# Patient Record
Sex: Female | Born: 1990 | Race: Black or African American | Hispanic: No | Marital: Single | Smoking: Never smoker
Health system: Southern US, Community
[De-identification: ages and names within clinical notes are randomized; demographics above are authoritative.]

## PROBLEM LIST (undated history)

## (undated) ENCOUNTER — Inpatient Hospital Stay (HOSPITAL_COMMUNITY): Payer: Self-pay

## (undated) DIAGNOSIS — F32A Depression, unspecified: Secondary | ICD-10-CM

## (undated) DIAGNOSIS — J45909 Unspecified asthma, uncomplicated: Secondary | ICD-10-CM

## (undated) DIAGNOSIS — E0590 Thyrotoxicosis, unspecified without thyrotoxic crisis or storm: Secondary | ICD-10-CM

## (undated) DIAGNOSIS — F419 Anxiety disorder, unspecified: Secondary | ICD-10-CM

## (undated) DIAGNOSIS — F329 Major depressive disorder, single episode, unspecified: Secondary | ICD-10-CM

## (undated) HISTORY — DX: Unspecified asthma, uncomplicated: J45.909

## (undated) HISTORY — PX: WISDOM TOOTH EXTRACTION: SHX21

## (undated) HISTORY — DX: Anxiety disorder, unspecified: F41.9

---
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
## 2015-03-18 ENCOUNTER — Telehealth: Payer: Self-pay | Admitting: *Deleted

## 2015-03-18 ENCOUNTER — Encounter: Payer: Self-pay | Admitting: *Deleted

## 2015-03-18 NOTE — Telephone Encounter (Signed)
Pre-Visit Call completed with patient and chart updated.   Pre-Visit Info documented in Specialty Comments under SnapShot.    

## 2015-03-21 ENCOUNTER — Ambulatory Visit (INDEPENDENT_AMBULATORY_CARE_PROVIDER_SITE_OTHER): Payer: Federal, State, Local not specified - PPO | Admitting: Physician Assistant

## 2015-03-21 ENCOUNTER — Encounter: Payer: Self-pay | Admitting: Physician Assistant

## 2015-03-21 VITALS — BP 93/53 | HR 75 | Temp 98.4°F | Resp 16 | Ht 68.0 in | Wt 181.5 lb

## 2015-03-21 DIAGNOSIS — F418 Other specified anxiety disorders: Secondary | ICD-10-CM

## 2015-03-21 DIAGNOSIS — F32A Depression, unspecified: Secondary | ICD-10-CM | POA: Insufficient documentation

## 2015-03-21 DIAGNOSIS — F329 Major depressive disorder, single episode, unspecified: Secondary | ICD-10-CM | POA: Insufficient documentation

## 2015-03-21 DIAGNOSIS — F419 Anxiety disorder, unspecified: Principal | ICD-10-CM

## 2015-03-21 LAB — CBC
HCT: 34.6 % — ABNORMAL LOW (ref 36.0–46.0)
Hemoglobin: 11.5 g/dL — ABNORMAL LOW (ref 12.0–15.0)
MCHC: 33.2 g/dL (ref 30.0–36.0)
MCV: 87.2 fl (ref 78.0–100.0)
Platelets: 188 10*3/uL (ref 150.0–400.0)
RBC: 3.97 Mil/uL (ref 3.87–5.11)
RDW: 14.7 % (ref 11.5–15.5)
WBC: 6.1 10*3/uL (ref 4.0–10.5)

## 2015-03-21 LAB — TSH: TSH: 0.29 u[IU]/mL — ABNORMAL LOW (ref 0.35–4.50)

## 2015-03-21 MED ORDER — ALPRAZOLAM 1 MG PO TABS: 1.0000 mg | ORAL_TABLET | Freq: Two times a day (BID) | ORAL | Status: DC | PRN

## 2015-03-21 MED ORDER — FLUOXETINE HCL 20 MG PO TABS: ORAL_TABLET | ORAL | Status: DC

## 2015-03-21 NOTE — Assessment & Plan Note (Addendum)
Will begin Fluoxetine 10 mg QD x 3 days before increasing to 20 mg QD.  Will also begin Xanax only PRN for acute anxiety.   Will check CBC and TSH today.  Patient instructed to follow-up with counseling as scheduled.  Short-term disability paperwork received from patient.  Will call once complete.  Follow-up in 1 month.

## 2015-03-21 NOTE — Progress Notes (Signed)
Pre visit review using our clinic review tool, if applicable. No additional management support is needed unless otherwise documented below in the visit note/SLS  

## 2015-03-21 NOTE — Patient Instructions (Signed)
Please go to the lab for blood work. I will call you with your results.  Please take the Fluoxetine as directed. Use the Xanax only if needed for acute anxiety.  Use no more than directed. Go to counseling session as scheduled.

## 2015-03-21 NOTE — Progress Notes (Signed)
   Patient presents to clinic today to establish care.  Acute Concerns: Patient c/o anxiety and depressed mood over the past year, that has been culminating in panic attacks starting 2-3 weeks ago.  Feels like she is becoming more introverted.  Patient was seen at outside Urgent Care on 4/16 for panic attacks.  Was given Rx for Paxil but states the medication made her mood worse.  Patient feels anxiety is mostly stemming from work and unhappiness with career.  Endorses some mild irregularity in stools with anxiety.  Endorses insomnia and anorexia. Denies SI/HI.  Has appointment with counseling services this week.  Past Medical History  Diagnosis Date  . Anxiety   . Childhood asthma     Resolved    Past Surgical History  Procedure Laterality Date  . Wisdom tooth extraction      No current outpatient prescriptions on file prior to visit.   No current facility-administered medications on file prior to visit.    No Known Allergies  Family History  Problem Relation Age of Onset  . Healthy Mother     Living  . Healthy Father     Living  . Diabetes Maternal Grandmother   . Diabetes Maternal Aunt   . Healthy Sister     x1  . Asthma Sister     #2-Resolved    History   Social History  . Marital Status: Single    Spouse Name: N/A  . Number of Children: N/A  . Years of Education: N/A   Occupational History  . Not on file.   Social History Main Topics  . Smoking status: Never Smoker   . Smokeless tobacco: Not on file  . Alcohol Use: Not on file  . Drug Use: Not on file  . Sexual Activity: Not on file   Other Topics Concern  . Not on file   Social History Narrative   ROS Pertinent ROS are listed in the HPI.  BP 93/53 mmHg  Pulse 75  Temp(Src) 98.4 F (36.9 C) (Oral)  Resp 16  Ht 5\' 8"  (1.727 m)  Wt 181 lb 8 oz (82.328 kg)  BMI 27.60 kg/m2  SpO2 100%  LMP 03/07/2015  Physical Exam  Constitutional: She is oriented to person, place, and time and  well-developed, well-nourished, and in no distress.  HENT:  Head: Normocephalic and atraumatic.  Eyes: Conjunctivae are normal.  Neck: Neck supple. No thyromegaly present.  Cardiovascular: Normal rate, regular rhythm, normal heart sounds and intact distal pulses.   Pulmonary/Chest: Effort normal and breath sounds normal. No respiratory distress. She has no wheezes. She has no rales. She exhibits no tenderness.  Lymphadenopathy:    She has no cervical adenopathy.  Neurological: She is alert and oriented to person, place, and time.  Skin: Skin is warm and dry. No rash noted.  Psychiatric: Affect normal.  Vitals reviewed.  Assessment/Plan: Anxiety and depression Will begin Fluoxetine 10 mg QD x 3 days before increasing to 20 mg QD.  Will also begin Xanax only PRN for acute anxiety.   Will check CBC and TSH today.  Patient instructed to follow-up with counseling as scheduled.  Short-term disability paperwork received from patient.  Will call once complete.  Follow-up in 1 month.

## 2015-03-23 ENCOUNTER — Other Ambulatory Visit: Payer: Self-pay | Admitting: Physician Assistant

## 2015-03-23 DIAGNOSIS — R7989 Other specified abnormal findings of blood chemistry: Secondary | ICD-10-CM

## 2015-03-24 ENCOUNTER — Telehealth: Payer: Self-pay | Admitting: Physician Assistant

## 2015-03-24 NOTE — Telephone Encounter (Signed)
Caller name: Alaena Relation to pt: self Call back number: [phone] Pharmacy:  Reason for call:   Patient states that Selena Batten[employer]ody was going to fax over FMLA forms yesterday to her employer but they are stating that they have not received fax as of yet. Patient is also requesting a copy for herself and will pick up.

## 2015-03-24 NOTE — Telephone Encounter (Signed)
Per Heidi Ford's OV note, we will call pt once forms are ready. JG//CMA

## 2015-03-25 NOTE — Telephone Encounter (Signed)
Forms faxed successfully to [employer], confirmation received. Called and informed pt and let her know that originals are at our front desk for her to pick up. Copy sent for scanning. JG//CMA

## 2015-03-25 NOTE — Telephone Encounter (Signed)
Informed patient of this. She states employer needs this by Tuesday at the latest

## 2015-04-06 ENCOUNTER — Other Ambulatory Visit (INDEPENDENT_AMBULATORY_CARE_PROVIDER_SITE_OTHER): Payer: Federal, State, Local not specified - PPO

## 2015-04-06 DIAGNOSIS — R7989 Other specified abnormal findings of blood chemistry: Secondary | ICD-10-CM

## 2015-04-06 DIAGNOSIS — R946 Abnormal results of thyroid function studies: Secondary | ICD-10-CM | POA: Diagnosis not present

## 2015-04-06 LAB — T4, FREE: Free T4: 0.87 ng/dL (ref 0.60–1.60)

## 2015-04-06 LAB — TSH: TSH: 0.3 u[IU]/mL — ABNORMAL LOW (ref 0.35–4.50)

## 2015-06-06 ENCOUNTER — Encounter: Payer: Self-pay | Admitting: Physician Assistant

## 2015-06-06 ENCOUNTER — Ambulatory Visit (INDEPENDENT_AMBULATORY_CARE_PROVIDER_SITE_OTHER): Payer: Federal, State, Local not specified - PPO | Admitting: Physician Assistant

## 2015-06-06 ENCOUNTER — Ambulatory Visit: Payer: Federal, State, Local not specified - PPO | Admitting: Physician Assistant

## 2015-06-06 VITALS — BP 124/61 | HR 87 | Temp 98.2°F | Ht 68.0 in | Wt 164.6 lb

## 2015-06-06 DIAGNOSIS — M79672 Pain in left foot: Secondary | ICD-10-CM | POA: Diagnosis not present

## 2015-06-06 DIAGNOSIS — M79671 Pain in right foot: Secondary | ICD-10-CM | POA: Diagnosis not present

## 2015-06-06 MED ORDER — MELOXICAM 15 MG PO TABS: 15.0000 mg | ORAL_TABLET | Freq: Every day | ORAL | Status: DC

## 2015-06-06 NOTE — Patient Instructions (Signed)
Please take Meloxicam once daily with food as directed. Use Tylenol Extra Strength later in the day if needed for breakthrough pain. Alternate ice/heat to knee. Wear supportive shoes. Take a break from the gym over the next week. Follow-up if symptoms are not resolving.

## 2015-06-06 NOTE — Progress Notes (Signed)
Pre visit review using our clinic review tool, if applicable. No additional management support is needed unless otherwise documented below in the visit note. 

## 2015-06-06 NOTE — Assessment & Plan Note (Signed)
Muscular. Rx Mobic. Limit gym time over next week. Supportive footwear and RICE therapy reviewed and recommended. Follow-up if symptoms are not resolving.

## 2015-06-06 NOTE — Progress Notes (Signed)
Patient presents to clinic today c/o pain in bilateral feet x 1 week described as throbbing.  Has recently started working out and noticed pain started the morning following an intense workout. Also notes mild L knee pain with walking but denies swelling, bruising, numbness or tingling.  Has been taking Advil with some relief of symptoms.   Past Medical History  Diagnosis Date  . Anxiety   . Childhood asthma     Resolved    Current Outpatient Prescriptions on File Prior to Visit  Medication Sig Dispense Refill  . ALPRAZolam (XANAX) 1 MG tablet Take 1 tablet (1 mg total) by mouth 2 (two) times daily as needed for anxiety. 30 tablet 0  . FLUoxetine (PROZAC) 20 MG tablet Take 1/2 tablet by mouth daily for 3 days.  Then increase to 1 tablet daily. 30 tablet 3   No current facility-administered medications on file prior to visit.    No Known Allergies  Family History  Problem Relation Age of Onset  . Healthy Mother     Living  . Healthy Father     Living  . Diabetes Maternal Grandmother   . Diabetes Maternal Aunt   . Healthy Sister     x1  . Asthma Sister     #2-Resolved    History   Social History  . Marital Status: Single    Spouse Name: N/A  . Number of Children: N/A  . Years of Education: N/A   Social History Main Topics  . Smoking status: Never Smoker   . Smokeless tobacco: Not on file  . Alcohol Use: Not on file  . Drug Use: Not on file  . Sexual Activity: Not on file   Other Topics Concern  . None   Social History Narrative   Review of Systems - See HPI.  All other ROS are negative.  BP 124/61 mmHg  Pulse 87  Temp(Src) 98.2 F (36.8 C) (Oral)  Ht  (1.727 m)  Wt 164 lb 9.6 oz (74.662 kg)  BMI 25.03 kg/m2  SpO2 100%  LMP 05/24/2015  Physical Exam  Constitutional: She is well-developed, well-nourished, and in no distress.  HENT:  Head: Normocephalic and atraumatic.  Eyes: Conjunctivae are normal.  Cardiovascular: Normal rate, regular  rhythm, normal heart sounds and intact distal pulses.   Pulmonary/Chest: Effort normal and breath sounds normal. No respiratory distress. She has no wheezes. She has no rales. She exhibits no tenderness.  Musculoskeletal:       Right knee: Normal.       Left knee: Normal.       Right ankle: Normal.       Left ankle: Normal.       Right foot: Normal.       Left foot: Normal.  Vitals reviewed.   Recent Results (from the past 2160 hour(s))  TSH     Status: Abnormal   Collection Time: 03/21/15  9:34 AM  Result Value Ref Range   TSH 0.29 (L) 0.35 - 4.50 uIU/mL  CBC     Status: Abnormal   Collection Time: 03/21/15  9:34 AM  Result Value Ref Range   WBC 6.1 4.0 - 10.5 K/uL   RBC 3.97 3.87 - 5.11 Mil/uL   Platelets 188.0 150.0 - 400.0 K/uL   Hemoglobin 11.5 (L) 12.0 - 15.0 g/dL   HCT 96.0 (L) 45.4 - 09.8 %   MCV 87.2 78.0 - 100.0 fl   MCHC 33.2 30.0 - 36.0 g/dL  RDW 14.7 11.5 - 15.5 %  TSH     Status: Abnormal   Collection Time: 04/06/15  1:27 PM  Result Value Ref Range   TSH 0.30 (L) 0.35 - 4.50 uIU/mL  T4, free     Status: None   Collection Time: 04/06/15  1:27 PM  Result Value Ref Range   Free T4 0.87 0.60 - 1.60 ng/dL    Assessment/Plan: Pain in both feet Muscular. Rx Mobic. Limit gym time over next week. Supportive footwear and RICE therapy reviewed and recommended. Follow-up if symptoms are not resolving.

## 2015-06-07 ENCOUNTER — Telehealth: Payer: Self-pay | Admitting: Physician Assistant

## 2015-06-07 NOTE — Telephone Encounter (Signed)
Pt was no show 06/06/15 8:15am but came in at 3:00pm same day, charge no show fee?

## 2015-06-08 NOTE — Telephone Encounter (Signed)
No charge. 

## 2015-07-05 ENCOUNTER — Telehealth: Payer: Self-pay | Admitting: Physician Assistant

## 2015-07-05 NOTE — Telephone Encounter (Signed)
Caller name: Relation to ZO:XWRU Call back [phone] Pharmacy:  Reason for call: pt is needing to get a copy immunization records for school. Please call when ready pt will come pick up Pt need asap, class start on next Wednesday.

## 2015-07-06 NOTE — Telephone Encounter (Signed)
Called and spoke with the pt and informed her that we do not have any immunization records on her.  Pt verbalized understanding and stated that she will call her parents to see where she needs to the records.//AB/CMA

## 2015-08-30 ENCOUNTER — Encounter: Payer: Self-pay | Admitting: Physician Assistant

## 2015-08-30 ENCOUNTER — Ambulatory Visit (INDEPENDENT_AMBULATORY_CARE_PROVIDER_SITE_OTHER): Payer: Federal, State, Local not specified - PPO | Admitting: Physician Assistant

## 2015-08-30 ENCOUNTER — Other Ambulatory Visit: Payer: Federal, State, Local not specified - PPO

## 2015-08-30 ENCOUNTER — Telehealth: Payer: Self-pay | Admitting: Physician Assistant

## 2015-08-30 VITALS — BP 98/66 | HR 94 | Temp 97.7°F | Resp 16 | Ht 68.0 in | Wt 142.4 lb

## 2015-08-30 DIAGNOSIS — N926 Irregular menstruation, unspecified: Secondary | ICD-10-CM

## 2015-08-30 DIAGNOSIS — O219 Vomiting of pregnancy, unspecified: Secondary | ICD-10-CM | POA: Diagnosis not present

## 2015-08-30 DIAGNOSIS — Z3201 Encounter for pregnancy test, result positive: Secondary | ICD-10-CM

## 2015-08-30 DIAGNOSIS — N910 Primary amenorrhea: Secondary | ICD-10-CM

## 2015-08-30 DIAGNOSIS — Z331 Pregnant state, incidental: Secondary | ICD-10-CM

## 2015-08-30 DIAGNOSIS — Z3490 Encounter for supervision of normal pregnancy, unspecified, unspecified trimester: Secondary | ICD-10-CM

## 2015-08-30 LAB — POCT URINE PREGNANCY: Preg Test, Ur: POSITIVE — AB

## 2015-08-30 MED ORDER — PYRIDOXINE HCL 25 MG PO TABS: 25.0000 mg | ORAL_TABLET | Freq: Three times a day (TID) | ORAL | Status: DC

## 2015-08-30 NOTE — Assessment & Plan Note (Signed)
Urine test confirms. Will check quantitative HCG today. Referral to OB placed. Will start Folic acid supplement daily. Rx Pyridoxine for nausea and vomiting. Will call with results.

## 2015-08-30 NOTE — Progress Notes (Signed)
Pre visit review using our clinic review tool, if applicable. No additional management support is needed unless otherwise documented below in the visit note/SLS  

## 2015-08-30 NOTE — Patient Instructions (Signed)
Please go to the lab for blood work. I will call with results. You will be contacted for assessment by OB/GYN. Please avoid starting new medications without consulting a pharmacist or a provider. Tylenol when needed for pain. Start a Folic acid supplement daily -- 600 mcg daily Take the nausea medication as directed when needed.   First Trimester of Pregnancy The first trimester of pregnancy is from week 1 until the end of week 12 (months 1 through 3). During this time, your baby will begin to develop inside you. At 6-8 weeks, the eyes and face are formed, and the heartbeat can be seen on ultrasound. At the end of 12 weeks, all the baby's organs are formed. Prenatal care is all the medical care you receive before the birth of your baby. Make sure you get good prenatal care and follow all of your doctor's instructions. HOME CARE  Medicines  Take medicine only as told by your doctor. Some medicines are safe and some are not during pregnancy.  Take your prenatal vitamins as told by your doctor.  Take medicine that helps you poop (stool softener) as needed if your doctor says it is okay. Diet  Eat regular, healthy meals.  Your doctor will tell you the amount of weight gain that is right for you.  Avoid raw meat and uncooked cheese.  If you feel sick to your stomach (nauseous) or throw up (vomit):  Eat 4 or 5 small meals a day instead of 3 large meals.  Try eating a few soda crackers.  Drink liquids between meals instead of during meals.  If you have a hard time pooping (constipation):  Eat high-fiber foods like fresh vegetables, fruit, and whole grains.  Drink enough fluids to keep your pee (urine) clear or pale yellow. Activity and Exercise  Exercise only as told by your doctor. Stop exercising if you have cramps or pain in your lower belly (abdomen) or low back.  Try to avoid standing for long periods of time. Move your legs often if you must stand in one place for a long  time.  Avoid heavy lifting.  Wear low-heeled shoes. Sit and stand up straight.  You can have sex unless your doctor tells you not to. Relief of Pain or Discomfort  Wear a good support bra if your breasts are sore.  Take warm water baths (sitz baths) to soothe pain or discomfort caused by hemorrhoids. Use hemorrhoid cream if your doctor says it is okay.  Rest with your legs raised if you have leg cramps or low back pain.  Wear support hose if you have puffy, bulging veins (varicose veins) in your legs. Raise (elevate) your feet for 15 minutes, 3-4 times a day. Limit salt in your diet. Prenatal Care  Schedule your prenatal visits by the twelfth week of pregnancy.  Write down your questions. Take them to your prenatal visits.  Keep all your prenatal visits as told by your doctor. Safety  Wear your seat belt at all times when driving.  Make a list of emergency phone numbers. The list should include numbers for family, friends, the hospital, and police and fire departments. General Tips  Ask your doctor for a referral to a local prenatal class. Begin classes no later than at the start of month 6 of your pregnancy.  Ask for help if you need counseling or help with nutrition. Your doctor can give you advice or tell you where to go for help.  Do not use hot tubs,  steam rooms, or saunas.  Do not douche or use tampons or scented sanitary pads.  Do not cross your legs for long periods of time.  Avoid litter boxes and soil used by cats.  Avoid all smoking, herbs, and alcohol. Avoid drugs not approved by your doctor.  Visit your dentist. At home, brush your teeth with a soft toothbrush. Be gentle when you floss. GET HELP IF:  You are dizzy.  You have mild cramps or pressure in your lower belly.  You have a nagging pain in your belly area.  You continue to feel sick to your stomach, throw up, or have watery poop (diarrhea).  You have a bad smelling fluid coming from your  vagina.  You have pain with peeing (urination).  You have increased puffiness (swelling) in your face, hands, legs, or ankles. GET HELP RIGHT AWAY IF:   You have a fever.  You are leaking fluid from your vagina.  You have spotting or bleeding from your vagina.  You have very bad belly cramping or pain.  You gain or lose weight rapidly.  You throw up blood. It may look like coffee grounds.  You are around people who have [place] measles, fifth disease, or chickenpox.  You have a very bad headache.  You have shortness of breath.  You have any kind of trauma, such as from a fall or a car accident. Document Released: 04/30/2008 Document Revised: 03/29/2014 Document Reviewed: 09/22/2013 [facility] Patient Information 2015 [place], [place]. This information is not intended to replace advice given to you by your health care provider. Make sure you discuss any questions you have with your health care provider.

## 2015-08-30 NOTE — Telephone Encounter (Signed)
Relation to EA:VWUJ Call back number: [phone]  Pharmacy: [id number] - [address] [place] - [address] AT [facility] [address] [phone] (Phone) [phone] (Fax)         Reason for call:  Pharmacy prescribed 100 MG of pyridOXINE (VITAMIN B-6) patient would like clarification of direction. Patient states that's not what you ordered but that's what pharmacy had. Please advise

## 2015-08-30 NOTE — Telephone Encounter (Signed)
Patient called back and reiterated instructions, understood 7 agreed/SLS

## 2015-08-30 NOTE — Telephone Encounter (Signed)
Spoke with the pharmacist at [facility] and was informed that patient's [aunt or mother?] came in to p/u Rx for pt, and because this is OTC supplement, 100 mg is the lowest dose available at pharmacy [you may be able to find lower dose at specialty vitamin store], and this why she was given/purchased this dosage. Informed pharmacist that patient does not have Vitamin B deficiency, that she has just has pregnancy confirmed today and this was px only for her nausea; and suggest 50 mg BID of the 100 mg LMOM with contact name and number [for return call, if needed] RE: Pyridoxine dosage per pharmacist and further provider instructions/SLS

## 2015-08-30 NOTE — Telephone Encounter (Addendum)
Definitely not what she needs. I do not want her taking 100 mg that they gave her. She can cut in half ant take 1/2 tablet (50 mg) twice daily instead since medication has been purchased already.

## 2015-08-30 NOTE — Progress Notes (Signed)
    Patient presents to clinic today requesting confirmation of+ home pregnancy test. Endorses taking home test 1 week ago. LMP 07/16/15. Endorses some mild nausea and vomiting in throughout the day.  Is prescribed Fluoxetine and Xanax for anxiety and depression. Stopped both medications   Past Medical History  Diagnosis Date  . Anxiety   . Childhood asthma     Resolved    No current outpatient prescriptions on file prior to visit.   No current facility-administered medications on file prior to visit.    No Known Allergies  Family History  Problem Relation Age of Onset  . Healthy Mother     Living  . Healthy Father     Living  . Diabetes Maternal Grandmother   . Diabetes Maternal Aunt   . Healthy Sister     x1  . Asthma Sister     #2-Resolved    Social History   Social History  . Marital Status: Single    Spouse Name: N/A  . Number of Children: N/A  . Years of Education: N/A   Social History Main Topics  . Smoking status: Never Smoker   . Smokeless tobacco: None  . Alcohol Use: None  . Drug Use: None  . Sexual Activity: Not Asked   Other Topics Concern  . None   Social History Narrative   Review of Systems - See HPI.  All other ROS are negative.  BP 98/66 mmHg  Pulse 94  Temp(Src) 97.7 F (36.5 C) (Oral)  Resp 16  Ht  (1.727 m)  Wt 142 lb 6 oz (64.581 kg)  BMI 21.65 kg/m2  SpO2 100%  LMP 07/16/2015  Physical Exam  Constitutional: She is oriented to person, place, and time and well-developed, well-nourished, and in no distress.  HENT:  Head: Normocephalic and atraumatic.  Eyes: Conjunctivae are normal.  Cardiovascular: Normal rate, regular rhythm, normal heart sounds and intact distal pulses.   Pulmonary/Chest: Effort normal and breath sounds normal. No respiratory distress. She has no wheezes. She has no rales. She exhibits no tenderness.  Neurological: She is alert and oriented to person, place, and time.  Skin: Skin is warm and dry. No  rash noted.  Vitals reviewed.    Assessment/Plan: Pregnant Urine test confirms. Will check quantitative HCG today. Referral to OB placed. Will start Folic acid supplement daily. Rx Pyridoxine for nausea and vomiting. Will call with results.

## 2015-08-31 LAB — HCG, QUANTITATIVE, PREGNANCY: HCG, BETA CHAIN, QUANT, S: 166204.5 m[IU]/mL

## 2015-09-05 ENCOUNTER — Ambulatory Visit: Payer: Federal, State, Local not specified - PPO | Admitting: Medical

## 2015-09-05 ENCOUNTER — Observation Stay (HOSPITAL_COMMUNITY)
Admission: AD | Admit: 2015-09-05 | Discharge: 2015-09-06 | Disposition: A | Discharge status: Home / Self Care | Payer: Federal, State, Local not specified - PPO | Source: Ambulatory Visit | Attending: Obstetrics and Gynecology | Admitting: Obstetrics and Gynecology

## 2015-09-05 ENCOUNTER — Encounter (HOSPITAL_COMMUNITY): Payer: Self-pay | Admitting: *Deleted

## 2015-09-05 DIAGNOSIS — Z3A01 Less than 8 weeks gestation of pregnancy: Secondary | ICD-10-CM | POA: Insufficient documentation

## 2015-09-05 DIAGNOSIS — O211 Hyperemesis gravidarum with metabolic disturbance: Secondary | ICD-10-CM | POA: Diagnosis present

## 2015-09-05 DIAGNOSIS — O99341 Other mental disorders complicating pregnancy, first trimester: Secondary | ICD-10-CM | POA: Insufficient documentation

## 2015-09-05 DIAGNOSIS — O210 Mild hyperemesis gravidarum: Principal | ICD-10-CM | POA: Insufficient documentation

## 2015-09-05 DIAGNOSIS — F419 Anxiety disorder, unspecified: Secondary | ICD-10-CM | POA: Insufficient documentation

## 2015-09-05 DIAGNOSIS — O99511 Diseases of the respiratory system complicating pregnancy, first trimester: Secondary | ICD-10-CM | POA: Insufficient documentation

## 2015-09-05 DIAGNOSIS — J45909 Unspecified asthma, uncomplicated: Secondary | ICD-10-CM | POA: Insufficient documentation

## 2015-09-05 LAB — CBC
HCT: 33.8 % — ABNORMAL LOW (ref 36.0–46.0)
Hemoglobin: 11.5 g/dL — ABNORMAL LOW (ref 12.0–15.0)
MCH: 29.6 pg (ref 26.0–34.0)
MCHC: 34 g/dL (ref 30.0–36.0)
MCV: 87.1 fL (ref 78.0–100.0)
PLATELETS: 165 10*3/uL (ref 150–400)
RBC: 3.88 MIL/uL (ref 3.87–5.11)
RDW: 13.1 % (ref 11.5–15.5)
WBC: 6.5 10*3/uL (ref 4.0–10.5)

## 2015-09-05 LAB — COMPREHENSIVE METABOLIC PANEL
ALT: 15 U/L (ref 14–54)
AST: 16 U/L (ref 15–41)
Albumin: 4.1 g/dL (ref 3.5–5.0)
Alkaline Phosphatase: 39 U/L (ref 38–126)
Anion gap: 8 (ref 5–15)
BUN: 12 mg/dL (ref 6–20)
CHLORIDE: 102 mmol/L (ref 101–111)
CO2: 24 mmol/L (ref 22–32)
CREATININE: 0.56 mg/dL (ref 0.44–1.00)
Calcium: 9.6 mg/dL (ref 8.9–10.3)
Glucose, Bld: 107 mg/dL — ABNORMAL HIGH (ref 65–99)
POTASSIUM: 3.1 mmol/L — AB (ref 3.5–5.1)
SODIUM: 134 mmol/L — AB (ref 135–145)
Total Bilirubin: 0.9 mg/dL (ref 0.3–1.2)
Total Protein: 7.4 g/dL (ref 6.5–8.1)

## 2015-09-05 LAB — TYPE AND SCREEN
ABO/RH(D): O POS
ANTIBODY SCREEN: NEGATIVE

## 2015-09-05 LAB — URINALYSIS, ROUTINE W REFLEX MICROSCOPIC
Glucose, UA: NEGATIVE mg/dL
Leukocytes, UA: NEGATIVE
NITRITE: NEGATIVE
PROTEIN: 30 mg/dL — AB
SPECIFIC GRAVITY, URINE: 1.025 (ref 1.005–1.030)
UROBILINOGEN UA: 1 mg/dL (ref 0.0–1.0)
pH: 6 (ref 5.0–8.0)

## 2015-09-05 LAB — URINE MICROSCOPIC-ADD ON

## 2015-09-05 MED ORDER — LACTATED RINGERS IV SOLN
INTRAVENOUS | Status: DC
  Administered 2015-09-05: 125 mL/h via INTRAVENOUS
  Administered 2015-09-06 (×2): via INTRAVENOUS

## 2015-09-05 MED ORDER — LACTATED RINGERS IV BOLUS (SEPSIS)
1000.0000 mL | Freq: Once | INTRAVENOUS | Status: AC
  Administered 2015-09-05: 1000 mL via INTRAVENOUS

## 2015-09-05 MED ORDER — ZOLPIDEM TARTRATE 5 MG PO TABS: 5.0000 mg | ORAL_TABLET | Freq: Every evening | ORAL | Status: DC | PRN

## 2015-09-05 MED ORDER — PROMETHAZINE HCL 25 MG/ML IJ SOLN
12.5000 mg | Freq: Four times a day (QID) | INTRAMUSCULAR | Status: DC | PRN
Start: 2015-09-05 — End: 2015-09-06

## 2015-09-05 MED ORDER — PROMETHAZINE HCL 25 MG/ML IJ SOLN
12.5000 mg | Freq: Once | INTRAMUSCULAR | Status: AC
  Administered 2015-09-05: 12.5 mg via INTRAVENOUS
  Filled 2015-09-05: qty 1

## 2015-09-05 MED ORDER — ONDANSETRON HCL 4 MG/2ML IJ SOLN: 4.0000 mg | Freq: Four times a day (QID) | INTRAMUSCULAR | Status: DC | PRN

## 2015-09-05 MED ORDER — DOCUSATE SODIUM 100 MG PO CAPS
100.0000 mg | ORAL_CAPSULE | Freq: Every day | ORAL | Status: DC
  Administered 2015-09-06: 100 mg via ORAL
  Filled 2015-09-05: qty 1

## 2015-09-05 MED ORDER — CALCIUM CARBONATE ANTACID 500 MG PO CHEW: 2.0000 | CHEWABLE_TABLET | ORAL | Status: DC | PRN

## 2015-09-05 MED ORDER — ACETAMINOPHEN 325 MG PO TABS: 650.0000 mg | ORAL_TABLET | ORAL | Status: DC | PRN

## 2015-09-05 MED ORDER — PRENATAL MULTIVITAMIN CH
1.0000 | ORAL_TABLET | Freq: Every day | ORAL | Status: DC
  Administered 2015-09-06: 1 via ORAL
  Filled 2015-09-05: qty 1

## 2015-09-05 MED ORDER — M.V.I. ADULT IV INJ
INJECTION | Freq: Once | INTRAVENOUS | Status: AC
  Administered 2015-09-05: 16:00:00 via INTRAVENOUS
  Filled 2015-09-05: qty 10

## 2015-09-05 NOTE — MAU Note (Signed)
Pt presents to MAU with complaints of nausea and vomiting. Denies any vaginal bleeding or abnormal discharge  

## 2015-09-05 NOTE — MAU Provider Note (Signed)
  History   G1 at 7.2 wks in with nausea and vomiting for 3 wks. States has only been able to keep down 2 smoothies this week. Weighed 146 at doctors office.  CSN: [id number]  Arrival date and time: 09/05/15 1345   None     Chief Complaint  Patient presents with  . Morning Sickness   HPI  OB History    Gravida Para Term Preterm AB TAB SAB Ectopic Multiple Living   1 0        0      Past Medical History  Diagnosis Date  . Anxiety   . Childhood asthma     Resolved    Past Surgical History  Procedure Laterality Date  . Wisdom tooth extraction      Family History  Problem Relation Age of Onset  . Healthy Mother     Living  . Healthy Father     Living  . Diabetes Maternal Grandmother   . Diabetes Maternal Aunt   . Healthy Sister     x1  . Asthma Sister     #2-Resolved    Social History  Substance Use Topics  . Smoking status: Never Smoker   . Smokeless tobacco: None  . Alcohol Use: No    Allergies: No Known Allergies  Prescriptions prior to admission  Medication Sig Dispense Refill Last Dose  . pyridOXINE (VITAMIN B-6) 25 MG tablet Take 1 tablet (25 mg total) by mouth 3 (three) times daily. 60 tablet 0     Review of Systems  Constitutional: Positive for weight loss.  HENT: Negative.   Eyes: Negative.   Respiratory: Negative.   Cardiovascular: Negative.   Gastrointestinal: Positive for nausea and vomiting.  Genitourinary: Negative.   Musculoskeletal: Negative.   Neurological: Negative.   Endo/Heme/Allergies: Negative.   Psychiatric/Behavioral: Negative.    Physical Exam   Blood pressure 139/82, pulse 101, temperature 98.2 F (36.8 C), resp. rate 18, height  (1.702 m), weight 146 lb (66.225 kg), last menstrual period 07/16/2015.  Physical Exam  Constitutional: She is oriented to person, place, and time. She appears well-developed and well-nourished.  HENT:  Head: Normocephalic.  Eyes: Pupils are equal, round, and reactive to light.   Neck: Normal range of motion.  Cardiovascular: Normal rate, regular rhythm, normal heart sounds and intact distal pulses.   Respiratory: Effort normal and breath sounds normal.  GI: Soft.  Musculoskeletal: Normal range of motion.  Neurological: She is alert and oriented to person, place, and time. She has normal reflexes.  Skin: Skin is warm and dry.  Psychiatric: She has a normal mood and affect. Her behavior is normal. Judgment and thought content normal.    MAU Course  Procedures  MDM Nausea and vomiting of pregnancy dehydration  Assessment and Plan  Today weight loss 140.6 with a loss of 5.4 lbs since office visit on 08/30/15. Admit per Dr. Barnabas Harries, MARIE DARLENE 09/05/2015, 2:46 PM

## 2015-09-06 LAB — ABO/RH: ABO/RH(D): O POS

## 2015-09-06 MED ORDER — PROMETHAZINE HCL 12.5 MG PO TABS: 12.5000 mg | ORAL_TABLET | Freq: Four times a day (QID) | ORAL | Status: DC | PRN

## 2015-09-06 MED ORDER — POTASSIUM CHLORIDE 20 MEQ PO PACK
40.0000 meq | PACK | Freq: Once | ORAL | Status: DC
  Filled 2015-09-06: qty 2

## 2015-09-06 MED ORDER — POTASSIUM CHLORIDE 10 MEQ/100ML IV SOLN
10.0000 meq | Freq: Once | INTRAVENOUS | Status: AC
  Administered 2015-09-06: 10 meq via INTRAVENOUS
  Filled 2015-09-06: qty 100

## 2015-09-06 MED ORDER — PROMETHAZINE HCL 25 MG PO TABS
12.5000 mg | ORAL_TABLET | Freq: Four times a day (QID) | ORAL | Status: DC | PRN
Start: 2015-09-06 — End: 2015-09-06

## 2015-09-06 NOTE — H&P (Signed)
Antenatal admission  Chief complaint: nausea and vomiting HPI: 24 year old  G1 P0 at 7+3 weeks estimated gestational age presents to maternity admissions with a week long history of intractable nausea and vomiting. She was seen by her primary care physician one week ago for confirmation of pregnancy and prescribed vitamin B6. Since that time she has not been able to keep significant food or fluids down and has only consumed two smoothies over the last week. In maternity admissions she received IV fluid hydration for greater than 80 ketones.  She's experienced a 5 pound weight loss since August 30, 2015. She continued to feel poorly and the decision was made to proceed with 23 hour  Observation and IV fluid hydration. PMH:  1) anxiety/depression  2) childhood asthma PSH: 1) wisdom tooth extraction  SH:  Non-smoker, denies alcohol or drug abuse PE:  Filed Vitals:   09/05/15 1849 09/05/15 2142 09/06/15 0550 09/06/15 0608  BP: 122/63 117/51 110/56   Pulse: 96 106 92   Temp: 99.8 F (37.7 C) 99.3 F (37.4 C) 99.2 F (37.3 C)   TempSrc: Oral Oral Oral   Resp: Height:      Weight:    145 lb (65.772 kg)  SpO2: 100% 100% 100%    Physical Exam  Constitutional: She is oriented to person, place, and time. She appears well-developed and well-nourished.  HENT:  Head: Normocephalic.  Eyes: Pupils are equal, round, and reactive to light.  Neck: Normal range of motion.  Cardiovascular: Normal rate, regular rhythm, normal heart sounds and intact distal pulses.  Respiratory: Effort normal and breath sounds normal.  GI: Soft.  Musculoskeletal: Normal range of motion.  Neurological: She is alert and oriented to person, place, and time. She has normal reflexes.  Skin: Skin is warm and dry.   Results for orders placed or performed during the hospital encounter of 09/05/15 (from the past 24 hour(s))  Urinalysis, Routine w reflex microscopic (not at [facility])     Status: Abnormal   Collection  Time: 09/05/15  2:00 PM  Result Value Ref Range   Color, Urine YELLOW YELLOW   APPearance HAZY (A) CLEAR   Specific Gravity, Urine 1.025 1.005 - 1.030   pH 6.0 5.0 - 8.0   Glucose, UA NEGATIVE NEGATIVE mg/dL   Hgb urine dipstick TRACE (A) NEGATIVE   Bilirubin Urine SMALL (A) NEGATIVE   Ketones, ur >80 (A) NEGATIVE mg/dL   Protein, ur 30 (A) NEGATIVE mg/dL   Urobilinogen, UA 1.0 0.0 - 1.0 mg/dL   Nitrite NEGATIVE NEGATIVE   Leukocytes, UA NEGATIVE NEGATIVE  Urine microscopic-add on     Status: Abnormal   Collection Time: 09/05/15  2:00 PM  Result Value Ref Range   Squamous Epithelial / LPF FEW (A) RARE   RBC / HPF 0-2 <3 RBC/hpf   Urine-Other MUCOUS PRESENT   CBC     Status: Abnormal   Collection Time: 09/05/15  3:10 PM  Result Value Ref Range   WBC 6.5 4.0 - 10.5 K/uL   RBC 3.88 3.87 - 5.11 MIL/uL   Hemoglobin 11.5 (L) 12.0 - 15.0 g/dL   HCT 13.0 (L) 86.5 - 78.4 %   MCV 87.1 78.0 - 100.0 fL   MCH 29.6 26.0 - 34.0 pg   MCHC 34.0 30.0 - 36.0 g/dL   RDW 69.6 29.5 - 28.4 %   Platelets 165 150 - 400 K/uL  Comprehensive metabolic panel     Status: Abnormal   Collection  Time: 09/05/15  3:10 PM  Result Value Ref Range   Sodium 134 (L) 135 - 145 mmol/L   Potassium 3.1 (L) 3.5 - 5.1 mmol/L   Chloride 102 101 - 111 mmol/L   CO2 24 22 - 32 mmol/L   Glucose, Bld 107 (H) 65 - 99 mg/dL   BUN 12 6 - 20 mg/dL   Creatinine, Ser 1.61 0.44 - 1.00 mg/dL   Calcium 9.6 8.9 - 09.6 mg/dL   Total Protein 7.4 6.5 - 8.1 g/dL   Albumin 4.1 3.5 - 5.0 g/dL   AST 16 15 - 41 U/L   ALT 15 14 - 54 U/L   Alkaline Phosphatase 39 38 - 126 U/L   Total Bilirubin 0.9 0.3 - 1.2 mg/dL   GFR calc non Af Amer >60 >60 mL/min   GFR calc Af Amer >60 >60 mL/min   Anion gap 8 5 - 15  Type and screen [facility] [place]     Status: None   Collection Time: 09/05/15  7:52 PM  Result Value Ref Range   ABO/RH(D) O POS    Antibody Screen NEG    Sample Expiration 09/08/2015   ABO/Rh     Status: None    Collection Time: 09/05/15  7:52 PM  Result Value Ref Range   ABO/RH(D) O POS     assessment and plan:  1) admit for 23hour observation in IV fluid hydration

## 2015-09-06 NOTE — Progress Notes (Signed)
Pt ambulated out teaching complete  

## 2015-09-06 NOTE — Discharge Summary (Signed)
  HD#2  Pt admitted for dehydration, N/V in early pregnancy.  Was taking Vit B6, but over last week lost 6 lbs and was only able to keep down 2 smoothies.  Pt was admitted for IV hydration and K+ was replaced.  Pt was tolerating food without N/V so is discharged with HG instructions and Rx for Phenergan tabs if needed.

## 2015-09-14 LAB — OB RESULTS CONSOLE RUBELLA ANTIBODY, IGM: RUBELLA: IMMUNE

## 2015-09-14 LAB — OB RESULTS CONSOLE RPR: RPR: NONREACTIVE

## 2015-09-14 LAB — OB RESULTS CONSOLE HEPATITIS B SURFACE ANTIGEN: HEP B S AG: NEGATIVE

## 2015-09-14 LAB — OB RESULTS CONSOLE ABO/RH: RH Type: POSITIVE

## 2015-09-14 LAB — OB RESULTS CONSOLE HIV ANTIBODY (ROUTINE TESTING): HIV: NONREACTIVE

## 2015-10-27 ENCOUNTER — Telehealth: Payer: Self-pay | Admitting: Physician Assistant

## 2015-10-27 NOTE — Telephone Encounter (Signed)
LM on VM for patient to call about Flu Shot

## 2016-01-24 NOTE — Telephone Encounter (Signed)
Documented in Health Maintenance.

## 2016-01-24 NOTE — Telephone Encounter (Signed)
LM for pt to call and schedule flu shot or update records.  2nd msg - please mark as declined per mgmt

## 2016-02-02 ENCOUNTER — Inpatient Hospital Stay (HOSPITAL_COMMUNITY)
Admission: AD | Admit: 2016-02-02 | Discharge: 2016-02-02 | Disposition: A | Discharge status: Home / Self Care | Payer: Medicaid Other | Source: Ambulatory Visit | Attending: Obstetrics and Gynecology | Admitting: Obstetrics and Gynecology

## 2016-02-02 ENCOUNTER — Encounter (HOSPITAL_COMMUNITY): Payer: Self-pay

## 2016-02-02 DIAGNOSIS — F418 Other specified anxiety disorders: Secondary | ICD-10-CM | POA: Diagnosis not present

## 2016-02-02 DIAGNOSIS — Z3A28 28 weeks gestation of pregnancy: Secondary | ICD-10-CM | POA: Diagnosis not present

## 2016-02-02 DIAGNOSIS — O99344 Other mental disorders complicating childbirth: Secondary | ICD-10-CM | POA: Insufficient documentation

## 2016-02-02 DIAGNOSIS — O4693 Antepartum hemorrhage, unspecified, third trimester: Secondary | ICD-10-CM | POA: Diagnosis not present

## 2016-02-02 LAB — URINE MICROSCOPIC-ADD ON

## 2016-02-02 LAB — URINALYSIS, ROUTINE W REFLEX MICROSCOPIC
Bilirubin Urine: NEGATIVE
Glucose, UA: NEGATIVE mg/dL
Ketones, ur: NEGATIVE mg/dL
NITRITE: NEGATIVE
PROTEIN: NEGATIVE mg/dL
Specific Gravity, Urine: 1.01 (ref 1.005–1.030)
pH: 6.5 (ref 5.0–8.0)

## 2016-02-02 LAB — WET PREP, GENITAL
SPERM: NONE SEEN
Trich, Wet Prep: NONE SEEN
Yeast Wet Prep HPF POC: NONE SEEN

## 2016-02-02 LAB — FETAL FIBRONECTIN: Fetal Fibronectin: NEGATIVE

## 2016-02-02 MED ORDER — METRONIDAZOLE 0.75 % VA GEL: 1.0000 | Freq: Every day | VAGINAL | Status: DC

## 2016-02-02 NOTE — Discharge Instructions (Signed)

## 2016-02-02 NOTE — MAU Note (Signed)
Pt c/o light pink vaginal spotting that she noticed when wiping today around 530pm. Denies pain. Denies urinary s/s. +FM.

## 2016-02-02 NOTE — MAU Provider Note (Signed)
History    Linward NatalSimone D Perrott is a 25y.o. G1P0 at 28.5wks who presents, after phone call, for vaginal bleeding. Patient states it started about 2 hours prior to arrival when she noted light pink discharge in her underwear and then additional discharge upon wiping.  Patient states she was at work when the incident occurred, but denies doing strenuous activity at work.   Patient denies LoF and cramping/contractions, but endorses good fetal movement.  Patient denies recent sexual intercourse, history of HSV, or abnormal pap smears.   Patient Active Problem List   Diagnosis Date Noted  . Hyperemesis gravidarum before end of [redacted] week gestation, dehydration 09/05/2015  . Pregnant 08/30/2015  . Pain in both feet 06/06/2015  . Anxiety and depression 03/21/2015    Chief Complaint  Patient presents with  . Vaginal Bleeding   HPI  OB History    Gravida Para Term Preterm AB TAB SAB Ectopic Multiple Living   1 0        0      Past Medical History  Diagnosis Date  . Anxiety   . Childhood asthma     Resolved    Past Surgical History  Procedure Laterality Date  . Wisdom tooth extraction      Family History  Problem Relation Age of Onset  . Healthy Mother     Living  . Healthy Father     Living  . Diabetes Maternal Grandmother   . Diabetes Maternal Aunt   . Healthy Sister     x1  . Asthma Sister     #2-Resolved    Social History  Substance Use Topics  . Smoking status: Never Smoker   . Smokeless tobacco: None  . Alcohol Use: No    Allergies: No Known Allergies  Prescriptions prior to admission  Medication Sig Dispense Refill Last Dose  . Prenatal Vit-Fe Fumarate-FA (PRENATAL MULTIVITAMIN) TABS tablet Take 1 tablet by mouth daily at 12 noon.     . promethazine (PHENERGAN) 12.5 MG tablet Take 1 tablet (12.5 mg total) by mouth every 6 (six) hours as needed for nausea or vomiting. 30 tablet 0   . pyridOXINE (VITAMIN B-6) 25 MG tablet Take 1 tablet (25 mg total) by mouth 3 (three)  times daily. 60 tablet 0 09/05/2015 at Unknown time    ROS  See HPI Above Physical Exam   Blood pressure 105/63, pulse 91, temperature 98.4 F (36.9 C), temperature source Oral, resp. rate 18, height 5' 6.5" (1.689 m), weight 69.854 kg (154 lb), last menstrual period 07/16/2015, SpO2 100 %.  No results found for this or any previous visit (from the past 24 hour(s)).  Physical Exam  Vitals reviewed. Constitutional: She is oriented to person, place, and time. She appears well-developed and well-nourished.  HENT:  Head: Normocephalic and atraumatic.  Eyes: Conjunctivae are normal.  Neck: Normal range of motion.  Cardiovascular: Normal rate.   Respiratory: Effort normal.  GI: Soft.  Genitourinary: Uterus is enlarged (Consistent with GA). Cervix exhibits friability. Cervix exhibits no motion tenderness and no discharge. There is bleeding (From external aspect of cervix) in the vagina. Vaginal discharge found.  Sterile Speculum Exam: -Vaginal Vault: Moderate amt malodorous thick whitish brown discharge-wet prep collected -Cervix:Active bleeding noted from external aspect.  No discharge from os, appears closed-GC/CT collected -FFN collected from posterior fornix -Bimanual Exam: Closed/Long/Thick/Ballotable  Musculoskeletal: Normal range of motion.  Neurological: She is alert and oriented to person, place, and time.  Skin: Skin is warm and  dry.     FHR:135 bpm, Mod Var, -Decels, +Accels UC: None graphed ED Course  Assessment: IUP at 28.5wks Cat I FT Vaginal Bleeding  Plan: -PE as above -Labs: Wet prep, UA, GC/CT, and FFN -Discussed labs to be drawn including FFN -Q/C -PO Hydration -Educated regarding VB in pregnancy   Follow Up (2125) -+Clue Cells Noted, fFN Negative -In room to discuss results -Educated regarding vaginal hygiene  -Rx for metrogel .75% QHS x 5 nights -No q/c -Keep appt as scheduled -Encouraged to call if any questions or concerns arise prior to next  scheduled office visit.  -Discharged to home in stable condition  Cherre Robins CNM, MSN 02/02/2016 8:45 PM

## 2016-02-03 LAB — GC/CHLAMYDIA PROBE AMP (~~LOC~~) NOT AT ARMC
CHLAMYDIA, DNA PROBE: NEGATIVE
NEISSERIA GONORRHEA: NEGATIVE

## 2016-03-30 LAB — OB RESULTS CONSOLE GBS: STREP GROUP B AG: POSITIVE

## 2016-04-16 ENCOUNTER — Inpatient Hospital Stay (HOSPITAL_COMMUNITY): Payer: Medicaid Other | Admitting: Anesthesiology

## 2016-04-16 ENCOUNTER — Encounter (HOSPITAL_COMMUNITY)
Admission: AD | Disposition: A | Discharge status: Home / Self Care | Payer: Self-pay | Source: Ambulatory Visit | Attending: Obstetrics and Gynecology

## 2016-04-16 ENCOUNTER — Inpatient Hospital Stay (HOSPITAL_COMMUNITY)
Admission: AD | Admit: 2016-04-16 | Discharge: 2016-04-19 | DRG: 766 | Disposition: A | Discharge status: Home / Self Care | Payer: Medicaid Other | Source: Ambulatory Visit | Attending: Obstetrics and Gynecology | Admitting: Obstetrics and Gynecology

## 2016-04-16 ENCOUNTER — Encounter (HOSPITAL_COMMUNITY): Payer: Self-pay | Admitting: *Deleted

## 2016-04-16 DIAGNOSIS — O99824 Streptococcus B carrier state complicating childbirth: Secondary | ICD-10-CM | POA: Diagnosis present

## 2016-04-16 DIAGNOSIS — Z3A39 39 weeks gestation of pregnancy: Secondary | ICD-10-CM

## 2016-04-16 DIAGNOSIS — D649 Anemia, unspecified: Secondary | ICD-10-CM | POA: Diagnosis present

## 2016-04-16 DIAGNOSIS — O9902 Anemia complicating childbirth: Secondary | ICD-10-CM | POA: Diagnosis present

## 2016-04-16 DIAGNOSIS — Z825 Family history of asthma and other chronic lower respiratory diseases: Secondary | ICD-10-CM

## 2016-04-16 DIAGNOSIS — Z833 Family history of diabetes mellitus: Secondary | ICD-10-CM | POA: Diagnosis not present

## 2016-04-16 DIAGNOSIS — Z98891 History of uterine scar from previous surgery: Secondary | ICD-10-CM

## 2016-04-16 DIAGNOSIS — IMO0001 Reserved for inherently not codable concepts without codable children: Secondary | ICD-10-CM

## 2016-04-16 LAB — CBC
HEMATOCRIT: 36.7 % (ref 36.0–46.0)
HEMOGLOBIN: 12.4 g/dL (ref 12.0–15.0)
MCH: 29.3 pg (ref 26.0–34.0)
MCHC: 33.8 g/dL (ref 30.0–36.0)
MCV: 86.8 fL (ref 78.0–100.0)
Platelets: 202 10*3/uL (ref 150–400)
RBC: 4.23 MIL/uL (ref 3.87–5.11)
RDW: 13.6 % (ref 11.5–15.5)
WBC: 13.6 10*3/uL — ABNORMAL HIGH (ref 4.0–10.5)

## 2016-04-16 LAB — TYPE AND SCREEN
ABO/RH(D): O POS
Antibody Screen: NEGATIVE

## 2016-04-16 SURGERY — Surgical Case
Anesthesia: Epidural

## 2016-04-16 MED ORDER — DIPHENHYDRAMINE HCL 50 MG/ML IJ SOLN: 12.5000 mg | INTRAMUSCULAR | Status: DC | PRN

## 2016-04-16 MED ORDER — ACETAMINOPHEN 325 MG PO TABS: 650.0000 mg | ORAL_TABLET | ORAL | Status: DC | PRN

## 2016-04-16 MED ORDER — KETOROLAC TROMETHAMINE 30 MG/ML IJ SOLN
INTRAMUSCULAR | Status: AC
  Filled 2016-04-16: qty 1

## 2016-04-16 MED ORDER — LACTATED RINGERS IV SOLN: 500.0000 mL | Freq: Once | INTRAVENOUS | Status: DC

## 2016-04-16 MED ORDER — SCOPOLAMINE 1 MG/3DAYS TD PT72
1.0000 | MEDICATED_PATCH | Freq: Once | TRANSDERMAL | Status: DC
  Filled 2016-04-16: qty 1

## 2016-04-16 MED ORDER — NALBUPHINE HCL 10 MG/ML IJ SOLN: 5.0000 mg | Freq: Once | INTRAMUSCULAR | Status: DC | PRN

## 2016-04-16 MED ORDER — LACTATED RINGERS IV SOLN
INTRAVENOUS | Status: DC | PRN
  Administered 2016-04-16 (×2): via INTRAVENOUS

## 2016-04-16 MED ORDER — WITCH HAZEL-GLYCERIN EX PADS: 1.0000 "application " | MEDICATED_PAD | CUTANEOUS | Status: DC | PRN

## 2016-04-16 MED ORDER — SODIUM CHLORIDE 0.9 % IV SOLN: 2.0000 g | Freq: Once | INTRAVENOUS | Status: DC

## 2016-04-16 MED ORDER — ONDANSETRON HCL 4 MG/2ML IJ SOLN: 4.0000 mg | Freq: Four times a day (QID) | INTRAMUSCULAR | Status: DC | PRN

## 2016-04-16 MED ORDER — LACTATED RINGERS IV SOLN: INTRAVENOUS | Status: DC

## 2016-04-16 MED ORDER — SODIUM BICARBONATE 8.4 % IV SOLN
INTRAVENOUS | Status: DC | PRN
  Administered 2016-04-16 (×2): 5 mL via EPIDURAL

## 2016-04-16 MED ORDER — OXYTOCIN 40 UNITS IN LACTATED RINGERS INFUSION - SIMPLE MED: 2.5000 [IU]/h | INTRAVENOUS | Status: AC

## 2016-04-16 MED ORDER — ACETAMINOPHEN 325 MG PO TABS
650.0000 mg | ORAL_TABLET | ORAL | Status: DC | PRN
  Administered 2016-04-16: 650 mg via ORAL
  Filled 2016-04-16 (×2): qty 2

## 2016-04-16 MED ORDER — OXYCODONE-ACETAMINOPHEN 5-325 MG PO TABS: 1.0000 | ORAL_TABLET | ORAL | Status: DC | PRN

## 2016-04-16 MED ORDER — OXYTOCIN 40 UNITS IN LACTATED RINGERS INFUSION - SIMPLE MED
2.5000 [IU]/h | INTRAVENOUS | Status: DC
  Filled 2016-04-16: qty 1000

## 2016-04-16 MED ORDER — SODIUM CHLORIDE 0.9% FLUSH: 3.0000 mL | INTRAVENOUS | Status: DC | PRN

## 2016-04-16 MED ORDER — ONDANSETRON HCL 4 MG/2ML IJ SOLN
INTRAMUSCULAR | Status: AC
  Filled 2016-04-16: qty 2

## 2016-04-16 MED ORDER — TETANUS-DIPHTH-ACELL PERTUSSIS 5-2.5-18.5 LF-MCG/0.5 IM SUSP: 0.5000 mL | Freq: Once | INTRAMUSCULAR | Status: DC

## 2016-04-16 MED ORDER — OXYTOCIN 10 UNIT/ML IJ SOLN
INTRAMUSCULAR | Status: AC
  Filled 2016-04-16: qty 4

## 2016-04-16 MED ORDER — OXYTOCIN BOLUS FROM INFUSION: 500.0000 mL | INTRAVENOUS | Status: DC

## 2016-04-16 MED ORDER — EPHEDRINE 5 MG/ML INJ: 10.0000 mg | INTRAVENOUS | Status: DC | PRN

## 2016-04-16 MED ORDER — MEASLES, MUMPS & RUBELLA VAC ~~LOC~~ INJ: 0.5000 mL | INJECTION | Freq: Once | SUBCUTANEOUS | Status: DC

## 2016-04-16 MED ORDER — MORPHINE SULFATE (PF) 0.5 MG/ML IJ SOLN
INTRAMUSCULAR | Status: AC
  Filled 2016-04-16: qty 10

## 2016-04-16 MED ORDER — NALOXONE HCL 0.4 MG/ML IJ SOLN: 0.4000 mg | INTRAMUSCULAR | Status: DC | PRN

## 2016-04-16 MED ORDER — MEPERIDINE HCL 25 MG/ML IJ SOLN: 6.2500 mg | INTRAMUSCULAR | Status: DC | PRN

## 2016-04-16 MED ORDER — NALBUPHINE HCL 10 MG/ML IJ SOLN: 5.0000 mg | INTRAMUSCULAR | Status: DC | PRN

## 2016-04-16 MED ORDER — SENNOSIDES-DOCUSATE SODIUM 8.6-50 MG PO TABS
2.0000 | ORAL_TABLET | ORAL | Status: DC
  Administered 2016-04-16 – 2016-04-18 (×3): 2 via ORAL
  Filled 2016-04-16 (×3): qty 2

## 2016-04-16 MED ORDER — FENTANYL 2.5 MCG/ML BUPIVACAINE 1/10 % EPIDURAL INFUSION (WH - ANES)
14.0000 mL/h | INTRAMUSCULAR | Status: DC | PRN
  Administered 2016-04-16: 14 mL/h via EPIDURAL
  Filled 2016-04-16: qty 125

## 2016-04-16 MED ORDER — FLEET ENEMA 7-19 GM/118ML RE ENEM: 1.0000 | ENEMA | RECTAL | Status: DC | PRN

## 2016-04-16 MED ORDER — FLEET ENEMA 7-19 GM/118ML RE ENEM: 1.0000 | ENEMA | Freq: Every day | RECTAL | Status: DC | PRN

## 2016-04-16 MED ORDER — PRENATAL MULTIVITAMIN CH: 1.0000 | ORAL_TABLET | Freq: Every day | ORAL | Status: DC

## 2016-04-16 MED ORDER — IBUPROFEN 600 MG PO TABS
600.0000 mg | ORAL_TABLET | Freq: Four times a day (QID) | ORAL | Status: DC
Start: 2016-04-16 — End: 2016-04-17
  Administered 2016-04-16 – 2016-04-17 (×2): 600 mg via ORAL
  Filled 2016-04-16 (×2): qty 1

## 2016-04-16 MED ORDER — DIBUCAINE 1 % RE OINT: 1.0000 "application " | TOPICAL_OINTMENT | RECTAL | Status: DC | PRN

## 2016-04-16 MED ORDER — NALOXONE HCL 2 MG/2ML IJ SOSY: 1.0000 ug/kg/h | PREFILLED_SYRINGE | INTRAVENOUS | Status: DC | PRN

## 2016-04-16 MED ORDER — SCOPOLAMINE 1 MG/3DAYS TD PT72: 1.0000 | MEDICATED_PATCH | Freq: Once | TRANSDERMAL | Status: DC

## 2016-04-16 MED ORDER — PROCHLORPERAZINE EDISYLATE 5 MG/ML IJ SOLN: 10.0000 mg | Freq: Once | INTRAMUSCULAR | Status: DC | PRN

## 2016-04-16 MED ORDER — ZOLPIDEM TARTRATE 5 MG PO TABS: 5.0000 mg | ORAL_TABLET | Freq: Every evening | ORAL | Status: DC | PRN

## 2016-04-16 MED ORDER — ONDANSETRON HCL 4 MG/2ML IJ SOLN
INTRAMUSCULAR | Status: DC | PRN
  Administered 2016-04-16: 4 mg via INTRAVENOUS

## 2016-04-16 MED ORDER — CEFAZOLIN SODIUM-DEXTROSE 2-3 GM-% IV SOLR
INTRAVENOUS | Status: DC | PRN
  Administered 2016-04-16: 2 g via INTRAVENOUS

## 2016-04-16 MED ORDER — LIDOCAINE HCL (PF) 1 % IJ SOLN
INTRAMUSCULAR | Status: DC | PRN
  Administered 2016-04-16 (×2): 6 mL

## 2016-04-16 MED ORDER — OXYTOCIN 10 UNIT/ML IJ SOLN
40.0000 [IU] | INTRAMUSCULAR | Status: DC | PRN
  Administered 2016-04-16: 40 [IU] via INTRAVENOUS

## 2016-04-16 MED ORDER — DIPHENHYDRAMINE HCL 25 MG PO CAPS: 25.0000 mg | ORAL_CAPSULE | Freq: Four times a day (QID) | ORAL | Status: DC | PRN

## 2016-04-16 MED ORDER — BISACODYL 10 MG RE SUPP: 10.0000 mg | Freq: Every day | RECTAL | Status: DC | PRN

## 2016-04-16 MED ORDER — LIDOCAINE HCL (PF) 1 % IJ SOLN
30.0000 mL | INTRAMUSCULAR | Status: DC | PRN
  Filled 2016-04-16: qty 30

## 2016-04-16 MED ORDER — SIMETHICONE 80 MG PO CHEW
80.0000 mg | CHEWABLE_TABLET | Freq: Three times a day (TID) | ORAL | Status: DC
  Administered 2016-04-17 – 2016-04-19 (×6): 80 mg via ORAL
  Filled 2016-04-16 (×7): qty 1

## 2016-04-16 MED ORDER — LACTATED RINGERS IV SOLN
INTRAVENOUS | Status: DC
  Administered 2016-04-16: 09:00:00 via INTRAVENOUS

## 2016-04-16 MED ORDER — HYDROMORPHONE HCL 1 MG/ML IJ SOLN: 0.2500 mg | INTRAMUSCULAR | Status: DC | PRN

## 2016-04-16 MED ORDER — PHENYLEPHRINE 40 MCG/ML (10ML) SYRINGE FOR IV PUSH (FOR BLOOD PRESSURE SUPPORT)
80.0000 ug | PREFILLED_SYRINGE | INTRAVENOUS | Status: DC | PRN
  Filled 2016-04-16: qty 10

## 2016-04-16 MED ORDER — MORPHINE SULFATE (PF) 0.5 MG/ML IJ SOLN
INTRAMUSCULAR | Status: DC | PRN
  Administered 2016-04-16: 4 mg via EPIDURAL

## 2016-04-16 MED ORDER — OXYCODONE-ACETAMINOPHEN 5-325 MG PO TABS: 2.0000 | ORAL_TABLET | ORAL | Status: DC | PRN

## 2016-04-16 MED ORDER — SIMETHICONE 80 MG PO CHEW
80.0000 mg | CHEWABLE_TABLET | ORAL | Status: DC
  Administered 2016-04-16 – 2016-04-18 (×3): 80 mg via ORAL
  Filled 2016-04-16 (×3): qty 1

## 2016-04-16 MED ORDER — ONDANSETRON HCL 4 MG/2ML IJ SOLN: 4.0000 mg | Freq: Three times a day (TID) | INTRAMUSCULAR | Status: DC | PRN

## 2016-04-16 MED ORDER — LACTATED RINGERS IV SOLN
500.0000 mL | INTRAVENOUS | Status: DC | PRN
  Administered 2016-04-16: 500 mL via INTRAVENOUS

## 2016-04-16 MED ORDER — NALOXONE HCL 2 MG/2ML IJ SOSY
1.0000 ug/kg/h | PREFILLED_SYRINGE | INTRAMUSCULAR | Status: DC | PRN
  Filled 2016-04-16: qty 2

## 2016-04-16 MED ORDER — LACTATED RINGERS IV SOLN
INTRAVENOUS | Status: DC | PRN
  Administered 2016-04-16: 12:00:00 via INTRAVENOUS

## 2016-04-16 MED ORDER — ONDANSETRON HCL 4 MG/2ML IJ SOLN
4.0000 mg | Freq: Three times a day (TID) | INTRAMUSCULAR | Status: DC | PRN
  Administered 2016-04-16: 4 mg via INTRAVENOUS
  Filled 2016-04-16: qty 2

## 2016-04-16 MED ORDER — COCONUT OIL OIL
1.0000 "application " | TOPICAL_OIL | Status: DC | PRN
  Administered 2016-04-18: 1 via TOPICAL
  Filled 2016-04-16: qty 120

## 2016-04-16 MED ORDER — PHENYLEPHRINE 40 MCG/ML (10ML) SYRINGE FOR IV PUSH (FOR BLOOD PRESSURE SUPPORT)
80.0000 ug | PREFILLED_SYRINGE | INTRAVENOUS | Status: DC | PRN
  Administered 2016-04-16: 80 ug via INTRAVENOUS

## 2016-04-16 MED ORDER — SIMETHICONE 80 MG PO CHEW: 80.0000 mg | CHEWABLE_TABLET | ORAL | Status: DC | PRN

## 2016-04-16 MED ORDER — LACTATED RINGERS IV SOLN
INTRAVENOUS | Status: DC
  Administered 2016-04-16: 09:00:00 via INTRAUTERINE

## 2016-04-16 MED ORDER — LACTATED RINGERS IV SOLN
INTRAVENOUS | Status: DC
  Administered 2016-04-16: 17:00:00 via INTRAVENOUS

## 2016-04-16 MED ORDER — MENTHOL 3 MG MT LOZG: 1.0000 | LOZENGE | OROMUCOSAL | Status: DC | PRN

## 2016-04-16 MED ORDER — ACETAMINOPHEN 500 MG PO TABS: 1000.0000 mg | ORAL_TABLET | Freq: Four times a day (QID) | ORAL | Status: DC

## 2016-04-16 MED ORDER — SODIUM CHLORIDE 0.9 % IV SOLN
2.0000 g | Freq: Once | INTRAVENOUS | Status: AC
  Administered 2016-04-16: 2 g via INTRAVENOUS
  Filled 2016-04-16: qty 2000

## 2016-04-16 MED ORDER — FERROUS SULFATE 325 (65 FE) MG PO TABS
325.0000 mg | ORAL_TABLET | Freq: Two times a day (BID) | ORAL | Status: DC
  Administered 2016-04-17 – 2016-04-18 (×4): 325 mg via ORAL
  Filled 2016-04-16 (×5): qty 1

## 2016-04-16 MED ORDER — CITRIC ACID-SODIUM CITRATE 334-500 MG/5ML PO SOLN
30.0000 mL | ORAL | Status: DC | PRN
  Administered 2016-04-16: 30 mL via ORAL
  Filled 2016-04-16: qty 15

## 2016-04-16 SURGICAL SUPPLY — 38 items
APL SKNCLS STERI-STRIP NONHPOA (GAUZE/BANDAGES/DRESSINGS) ×1
BENZOIN TINCTURE PRP APPL 2/3 (GAUZE/BANDAGES/DRESSINGS) ×3 IMPLANT
CLAMP CORD UMBIL (MISCELLANEOUS) IMPLANT
CLOSURE WOUND 1/2 X4 (GAUZE/BANDAGES/DRESSINGS) ×1
CLOTH BEACON ORANGE TIMEOUT ST (SAFETY) ×3 IMPLANT
CONTAINER PREFILL 10% NBF 15ML (MISCELLANEOUS) IMPLANT
DRAIN JACKSON PRT FLT 10 (DRAIN) IMPLANT
DRSG OPSITE POSTOP 4X10 (GAUZE/BANDAGES/DRESSINGS) ×3 IMPLANT
DURAPREP 26ML APPLICATOR (WOUND CARE) ×3 IMPLANT
ELECT REM PT RETURN 9FT ADLT (ELECTROSURGICAL) ×3
ELECTRODE REM PT RTRN 9FT ADLT (ELECTROSURGICAL) ×1 IMPLANT
EVACUATOR SILICONE 100CC (DRAIN) IMPLANT
EXTRACTOR VACUUM M CUP 4 TUBE (SUCTIONS) IMPLANT
EXTRACTOR VACUUM M CUP 4' TUBE (SUCTIONS)
GLOVE BIO SURGEON STRL SZ 6.5 (GLOVE) ×2 IMPLANT
GLOVE BIO SURGEONS STRL SZ 6.5 (GLOVE) ×1
GLOVE BIOGEL PI IND STRL 7.0 (GLOVE) ×2 IMPLANT
GLOVE BIOGEL PI INDICATOR 7.0 (GLOVE) ×4
GOWN STRL REUS W/TWL LRG LVL3 (GOWN DISPOSABLE) ×6 IMPLANT
KIT ABG SYR 3ML LUER SLIP (SYRINGE) IMPLANT
NDL HYPO 25X5/8 SAFETYGLIDE (NEEDLE) IMPLANT
NEEDLE HYPO 25X5/8 SAFETYGLIDE (NEEDLE) IMPLANT
NS IRRIG 1000ML POUR BTL (IV SOLUTION) ×3 IMPLANT
PACK C SECTION WH (CUSTOM PROCEDURE TRAY) ×3 IMPLANT
PAD OB MATERNITY 4.3X12.25 (PERSONAL CARE ITEMS) ×3 IMPLANT
PENCIL SMOKE EVAC W/HOLSTER (ELECTROSURGICAL) ×3 IMPLANT
RTRCTR C-SECT PINK 25CM LRG (MISCELLANEOUS) IMPLANT
STRIP CLOSURE SKIN 1/2X4 (GAUZE/BANDAGES/DRESSINGS) ×2 IMPLANT
SUT CHROMIC 0 CT 1 (SUTURE) ×3 IMPLANT
SUT MNCRL AB 3-0 PS2 27 (SUTURE) ×3 IMPLANT
SUT PLAIN 2 0 (SUTURE) ×6
SUT PLAIN 2 0 XLH (SUTURE) ×3 IMPLANT
SUT PLAIN ABS 2-0 CT1 27XMFL (SUTURE) ×2 IMPLANT
SUT SILK 2 0 SH (SUTURE) IMPLANT
SUT VIC AB 0 CTX 36 (SUTURE) ×12
SUT VIC AB 0 CTX36XBRD ANBCTRL (SUTURE) ×4 IMPLANT
TOWEL OR 17X24 6PK STRL BLUE (TOWEL DISPOSABLE) ×3 IMPLANT
TRAY FOLEY CATH SILVER 14FR (SET/KITS/TRAYS/PACK) ×3 IMPLANT

## 2016-04-16 NOTE — H&P (Signed)
Heidi Ford is Ford 25 y.o. female presenting for labor. Pt presented intact  And 8 cm dilated History OB History    Gravida Para Term Preterm AB TAB SAB Ectopic Multiple Living   1 0        0     Past Medical History  Diagnosis Date  . Anxiety   . Childhood asthma     Resolved   Past Surgical History  Procedure Laterality Date  . Wisdom tooth extraction     Family History: family history includes Asthma in her sister; Diabetes in her maternal aunt and maternal grandmother; Healthy in her father, mother, and sister. Social History:  reports that she has never smoked. She does not have any smokeless tobacco history on file. She reports that she does not drink alcohol or use illicit drugs.   Prenatal Transfer Tool  Maternal Diabetes: No Genetic Screening: Declined Maternal Ultrasounds/Referrals: Normal Fetal Ultrasounds or other Referrals:  None Maternal Substance Abuse:  No Significant Maternal Medications:  None Significant Maternal Lab Results:  None Other Comments:  Pt said to have hyperthyroidism.  She is not on meds and last labs are Wnl.    ROS  Physical Examination: General appearance - oriented to person, place, and time Mental status - alert, oriented to person, place, and time Chest - clear to auscultation, no wheezes, rales or rhonchi, symmetric air entry Heart - normal rate and regular rhythm Abdomen - soft, nontender, nondistended, no masses or organomegaly Extremities - Homan's sign negative bilaterally   Dilation: 9 Effacement (%): 80 Station: -1 Exam by:: Heidi Ford Blood pressure 123/69, pulse 71, temperature 98 F (36.7 C), temperature source Oral, resp. rate 18, height 5\' 7"  (1.702 m), weight 179 lb (81.194 kg), last menstrual period 07/16/2015, SpO2 100 %. Exam Physical Exam  Prenatal labs: ABO, Rh: --/--/O POS (05/22 91470855) Antibody: NEG (05/22 0855) Rubella: Immune (10/19 0000) RPR: Nonreactive (10/19 0000)  HBsAg: Negative (10/19 0000)  HIV:  Non-reactive (10/19 0000)  GBS: Positive (05/05 0000)   Assessment/Plan: Pt with persistent late declerations No improvement with amnioinfusion, oxygen or position changes R&B   Heidi Ford 04/16/2016, 11:30 AM

## 2016-04-16 NOTE — Addendum Note (Signed)
Addendum  created 04/16/16 1846 by Shanon PayorSuzanne M Kem Parcher, CRNA   Modules edited: Clinical Notes   Clinical Notes:  File: [id number]

## 2016-04-16 NOTE — Anesthesia Procedure Notes (Signed)
Epidural Patient location during procedure: OB  Staffing Anesthesiologist: Sherrian DiversENENNY, Jona Zappone  Preanesthetic Checklist Completed: patient identified, site marked, surgical consent, pre-op evaluation, timeout performed, IV checked, risks and benefits discussed and monitors and equipment checked  Epidural Patient position: sitting Prep: DuraPrep Patient monitoring: blood pressure and heart rate Approach: midline Location: L3-L4 Injection technique: LOR saline  Needle:  Needle type: Tuohy  Needle gauge: 17 G Needle length: 9 cm Needle insertion depth: 5.5 cm Catheter type: closed end flexible Catheter size: 19 Gauge Catheter at skin depth: 11 cm Test dose: negative and Other  Assessment Events: blood not aspirated, injection not painful, no injection resistance, negative IV test and no paresthesia  Additional Notes Reason for block:procedure for pain

## 2016-04-16 NOTE — Anesthesia Postprocedure Evaluation (Signed)
Anesthesia Post Note  Patient: Heidi Ford  Procedure(s) Performed: Procedure(s) (LRB): CESAREAN SECTION (N/A)  Patient location during evaluation: Mother Baby Anesthesia Type: Epidural Level of consciousness: awake and alert and oriented Pain management: pain level controlled Vital Signs Assessment: post-procedure vital signs reviewed and stable Respiratory status: spontaneous breathing and nonlabored ventilation Cardiovascular status: stable Postop Assessment: no headache, no backache, patient able to bend at knees, no signs of nausea or vomiting and adequate PO intake Anesthetic complications: no     Last Vitals:  Filed Vitals:   04/16/16 1529 04/16/16 1630  BP: 115/63 118/64  Pulse: 67 72  Temp:    Resp: 17 18    Last Pain:  Filed Vitals:   04/16/16 1833  PainSc: 0-No pain   Pain Goal:                 Madison HickmanGREGORY,Kamil Mchaffie

## 2016-04-16 NOTE — Anesthesia Postprocedure Evaluation (Signed)
Anesthesia Post Note  Patient: Heidi Ford  Procedure(s) Performed: Procedure(s) (LRB): CESAREAN SECTION (N/A)  Patient location during evaluation: PACU Anesthesia Type: Epidural Level of consciousness: awake and alert Pain management: pain level controlled Vital Signs Assessment: post-procedure vital signs reviewed and stable Respiratory status: spontaneous breathing, nonlabored ventilation, respiratory function stable and patient connected to nasal cannula oxygen Cardiovascular status: blood pressure returned to baseline and stable Postop Assessment: no signs of nausea or vomiting, epidural receding and patient able to bend at knees Anesthetic complications: no     Last Vitals:  Filed Vitals:   04/16/16 1330 04/16/16 1347  BP: 121/67 119/75  Pulse: 59 79  Temp:    Resp: 21 22    Last Pain:  Filed Vitals:   04/16/16 1352  PainSc: 0-No pain   Pain Goal:                 [name] J

## 2016-04-16 NOTE — Anesthesia Preprocedure Evaluation (Addendum)
Anesthesia Evaluation  Patient identified by MRN, date of birth, ID band Patient awake    Reviewed: Allergy & Precautions, NPO status , Patient's Chart, lab work & pertinent test results  Airway Mallampati: II  TM Distance: >3 FB Neck ROM: Full    Dental no notable dental hx.    Pulmonary asthma ,    Pulmonary exam normal breath sounds clear to auscultation       Cardiovascular negative cardio ROS Normal cardiovascular exam Rhythm:Regular Rate:Normal     Neuro/Psych PSYCHIATRIC DISORDERS Anxiety negative neurological ROS     GI/Hepatic negative GI ROS, Neg liver ROS,   Endo/Other  negative endocrine ROS  Renal/GU negative Renal ROS  negative genitourinary   Musculoskeletal negative musculoskeletal ROS (+)   Abdominal   Peds negative pediatric ROS (+)  Hematology negative hematology ROS (+)   Anesthesia Other Findings   Reproductive/Obstetrics negative OB ROS                             Anesthesia Physical Anesthesia Plan  ASA: II  Anesthesia Plan: Epidural   Post-op Pain Management:    Induction: Intravenous  Airway Management Planned: Natural Airway  Additional Equipment:   Intra-op Plan:   Post-operative Plan:   Informed Consent: I have reviewed the patients History and Physical, chart, labs and discussed the procedure including the risks, benefits and alternatives for the proposed anesthesia with the patient or authorized representative who has indicated his/her understanding and acceptance.   Dental advisory given  Plan Discussed with: CRNA  Anesthesia Plan Comments: (Informed consent obtained prior to proceeding including risk of failure, 1% risk of PDPH, risk of minor discomfort and bruising.  Discussed rare but serious complications including epidural abscess, permanent nerve injury, epidural hematoma.  Discussed alternatives to epidural analgesia and patient  desires to proceed.  Timeout performed pre-procedure verifying patient name, procedure, and platelet count.  Patient tolerated procedure well.  1115: Csection called for non-reassuring fetal tracing. Epidural has been working well. Plan to use epidural for C/S. Discussed with patient. )       Anesthesia Quick Evaluation

## 2016-04-16 NOTE — MAU Note (Signed)
C/o ucs since 1700 yesterday afternoon;  ?SROM @ 2200 last night;

## 2016-04-16 NOTE — Lactation Note (Signed)
This note was copied from a baby's chart. Lactation Consultation Note  Patient Name: Girl Beckie SaltsSimone Mcdougle WUJWJ'XToday's Date: 04/16/2016 Reason for consult: Initial assessment Baby at 5 hr of life and mom is having trouble latching baby. Baby is lip/tongue sucking per RN. Upon entry baby was sleeping at the breast. Several attempts were made to wake baby with no success. Discussed baby behavior, feeding frequency, baby belly size, voids, wt loss, breast changes, and nipple care. Mom knows how to manually express, has seen colostrum bilaterally, and has a spoon in the room. Given lactation handouts. Aware of OP services and support group.   Maternal Data Has patient been taught Hand Expression?: Yes Does the patient have breastfeeding experience prior to this delivery?: No  Feeding Feeding Type: Breast Fed Length of feed: 0 min  LATCH Score/Interventions Latch: Too sleepy or reluctant, no latch achieved, no sucking elicited. Intervention(s): Skin to skin;Teach feeding cues;Waking techniques Intervention(s): Adjust position;Assist with latch;Breast compression  Audible Swallowing: None Intervention(s): Hand expression;Skin to skin  Type of Nipple: Everted at rest and after stimulation  Comfort (Breast/Nipple): Soft / non-tender     Hold (Positioning): Full assist, staff holds infant at breast Intervention(s): Support Pillows;Position options  LATCH Score: 4  Lactation Tools Discussed/Used WIC Program: Yes   Consult Status Consult Status: Follow-up Date: 04/17/16 Follow-up type: In-patient    Rulon Eisenmengerlizabeth E Brittni Hult 04/16/2016, 5:14 PM

## 2016-04-16 NOTE — Transfer of Care (Signed)
Immediate Anesthesia Transfer of Care Note  Patient: Heidi Ford  Procedure(s) Performed: Procedure(s): CESAREAN SECTION (N/A)  Patient Location: PACU  Anesthesia Type:Epidural  Level of Consciousness: awake, alert  and oriented  Airway & Oxygen Therapy: Patient Spontanous Breathing  Post-op Assessment: Report given to RN and Post -op Vital signs reviewed and stable  Post vital signs: Reviewed and stable  Last Vitals:  Filed Vitals:   04/16/16 1121 04/16/16 1124  BP: 123/69 116/68  Pulse: 71 87  Temp:    Resp:      Last Pain:  Filed Vitals:   04/16/16 1154  PainSc: 0-No pain         Complications: No apparent anesthesia complications

## 2016-04-16 NOTE — Op Note (Signed)
Cesarean Section Procedure Note   Linward NatalSimone D Gabler  04/16/2016  Indications: Fetal Intolerance to Labor   Pre-operative Diagnosis: fetal indication .   Post-operative Diagnosis: Same   Surgeon: Surgeon(s) and Role:    * Jaymes GraffNaima Kavion Mancinas, MD - Primary   Assistants: none   Anesthesia: epidural   Procedure Details:  The patient was seen in the Holding Room. The risks, benefits, complications, treatment options, and expected outcomes were discussed with the patient. The patient concurred with the proposed plan, giving informed consent. identified as Linward NatalSimone D Menor and the procedure verified as C-Section Delivery. A Time Out was held and the above information confirmed.  After induction of anesthesia, the patient was draped and prepped in the usual sterile manner. A transverse incision was made and carried down through the subcutaneous tissue to the fascia. Fascial incision was made in the midline and extended transversely. The fascia was separated from the underlying rectus muscle superiorly and inferiorly. The peritoneum was identified and entered. Peritoneal incision was extended longitudinally with good visualization of bowel and bladder. The utero-vesical peritoneal reflection was incised transversely and the bladder flap was bluntly freed from the lower uterine segment.  An alexsis retractor was placed in the abdomen.   A low transverse uterine incision was made. Delivered from cephalic presentation was a  infant, with Apgar scores of 8 at one minute and 9 at five minutes. Cord ph was sent arterial 7.14, venous 7.12 the umbilical cord was clamped and cut cord blood was obtained for evaluation. The placenta was removed Intact and appeared normal. The uterine outline, tubes and ovaries appeared normal}. The uterine incision was closed with running locked sutures of 0Vicryl. A second layer 0 vicrlyl was used to imbricate the uterine incision    Hemostasis was observed. Lavage was carried out until  clear. The alexsis was removed.  The peritoneum was closed with 0 chromic.  The muscles were examined and any bleeders were made hemostatic using bovie cautery device.   The fascia was then reapproximated with running sutures of 0 vicryl.  The subcutaneous tissue was reapproximated  With interrupted stitches using 2-0 plain gut. The subcuticular closure was performed using 3-320monocryl     Instrument, sponge, and needle counts were correct prior the abdominal closure and were correct at the conclusion of the case.    Findings: infant was delivered from vtx presentation. The fluid was sig for thick meconium.  The uterus tubes and ovaries appeared normal.     Estimated Blood Loss: 800ml   Total IV Fluids: 2500ml   Urine Output: 100CC OF clear urine  Specimens: placenta to pathology  Complications: no complications  Disposition: PACU - hemodynamically stable.   Maternal Condition: stable   Baby condition / location:  Couplet care / Skin to Skin  Attending Attestation: I performed the procedure.   Signed: Surgeon(s): Jaymes GraffNaima Toneisha Savary, MD

## 2016-04-17 LAB — RPR: RPR: NONREACTIVE

## 2016-04-17 LAB — CBC
HCT: 27.1 % — ABNORMAL LOW (ref 36.0–46.0)
Hemoglobin: 9.1 g/dL — ABNORMAL LOW (ref 12.0–15.0)
MCH: 29.4 pg (ref 26.0–34.0)
MCHC: 33.6 g/dL (ref 30.0–36.0)
MCV: 87.7 fL (ref 78.0–100.0)
PLATELETS: 145 10*3/uL — AB (ref 150–400)
RBC: 3.09 MIL/uL — ABNORMAL LOW (ref 3.87–5.11)
RDW: 14 % (ref 11.5–15.5)
WBC: 9.7 10*3/uL (ref 4.0–10.5)

## 2016-04-17 LAB — TSH: TSH: 1.828 u[IU]/mL (ref 0.350–4.500)

## 2016-04-17 LAB — T4, FREE: FREE T4: 0.9 ng/dL (ref 0.61–1.12)

## 2016-04-17 MED ORDER — OXYCODONE-ACETAMINOPHEN 5-325 MG PO TABS: 2.0000 | ORAL_TABLET | Freq: Four times a day (QID) | ORAL | Status: DC | PRN

## 2016-04-17 MED ORDER — COMPLETENATE 29-1 MG PO CHEW
1.0000 | CHEWABLE_TABLET | Freq: Every day | ORAL | Status: DC
  Administered 2016-04-17 – 2016-04-18 (×2): 1 via ORAL
  Filled 2016-04-17 (×4): qty 1

## 2016-04-17 MED ORDER — OXYCODONE-ACETAMINOPHEN 5-325 MG PO TABS
1.0000 | ORAL_TABLET | ORAL | Status: DC | PRN
  Administered 2016-04-17 – 2016-04-19 (×7): 1 via ORAL
  Filled 2016-04-17 (×8): qty 1

## 2016-04-17 MED ORDER — OXYCODONE-ACETAMINOPHEN 5-325 MG PO TABS: 2.0000 | ORAL_TABLET | ORAL | Status: DC | PRN

## 2016-04-17 MED ORDER — OXYCODONE-ACETAMINOPHEN 5-325 MG PO TABS
1.0000 | ORAL_TABLET | Freq: Four times a day (QID) | ORAL | Status: DC | PRN
Start: 2016-04-17 — End: 2016-04-17

## 2016-04-17 MED ORDER — IBUPROFEN 100 MG/5ML PO SUSP
600.0000 mg | Freq: Four times a day (QID) | ORAL | Status: DC
  Administered 2016-04-17 – 2016-04-19 (×8): 600 mg via ORAL
  Filled 2016-04-17 (×13): qty 30

## 2016-04-17 NOTE — Lactation Note (Addendum)
This note was copied from a baby's chart. Lactation Consultation Note  Patient Name: Heidi Ford ZOXWR'UToday's Date: 04/17/2016 Reason for consult: Follow-up assessment Baby 27 hours old. Mom reports that baby has been sleeping and has not nursed in 4 hours. Assisted mom to latch baby directly to breast. Mom return-demonstrated hand expression with colostrum flowing. However, baby would not suckle either this LC's gloved finger or mom's breast. Mom has tubular breast with flat nipples. Mom has NS, and mom return-demonstrated application of NS, but baby would not latch and suckle with NS either. Set mom up with DEBP and mom able to pump 25 ml. FOB and Irving BurtonEmily, RN syringe and finger-fed baby 10 ml of EBM. Enc parents to put baby to breast with cues and at least every 3 hours d/t baby's weight, supplement with EBM, and then post-pump afterwards--followed by hand expression. Discussed EBM storage guidelines. Discussed supplementing baby through nipple shield with syringe or SNS, especially as baby becomes more alert and willing to suckle.   Maternal Data    Feeding Feeding Type: Breast Milk Length of feed: 10 min  LATCH Score/Interventions Latch: Too sleepy or reluctant, no latch achieved, no sucking elicited. Intervention(s): Skin to skin;Teach feeding cues;Waking techniques Intervention(s): Adjust position;Assist with latch;Breast compression  Audible Swallowing: None  Type of Nipple: Flat  Comfort (Breast/Nipple): Soft / non-tender     Hold (Positioning): Assistance needed to correctly position infant at breast and maintain latch. Intervention(s): Breastfeeding basics reviewed;Support Pillows;Position options;Skin to skin  LATCH Score: 4  Lactation Tools Discussed/Used Tools: Pump;Nipple Shields Nipple shield size: 20 Breast pump type: Double-Electric Breast Pump Pump Review: Setup, frequency, and cleaning;Milk Storage Initiated by:: JW Date initiated:: 04/17/16   Consult  Status Consult Status: Follow-up Date: 04/18/16 Follow-up type: In-patient    Geralynn OchsWILLIARD, Tamila Gaulin 04/17/2016, 4:58 PM

## 2016-04-17 NOTE — Lactation Note (Signed)
This note was copied from a baby's chart. Lactation Consultation Note New mom unable to get baby to latch. Hasn't had good feedings. Baby snorting d/t appears to be nasal edema and stuffiness. Encouraged FOB to hold baby upright when holding baby to aide in breathing better w/less snorting noises.  Mom has long tubular asymmetrical breast w/Lt. Larger than Rt. Mom has flat nipples. Has 2-3 fingers width between breast.  Areolas had edema with reverse pressure. Noted areola and nipple softened significantly. Hand expression taught w/easy flow of colostrum. collected 5ml. Baby took 3 ml at most.  RN wanted to give pt. A NS. I don't think a NS would stay on the nipple d/t roundness and to the end of the breast. Encouraged when latching to breast w/o anything except nipple. Breast and areola compress well for obtaining latch.  Patient Name: Girl Beckie SaltsSimone Pilant ZOXWR'UToday's Date: 04/17/2016 Reason for consult: Follow-up assessment;Difficult latch   Maternal Data    Feeding Feeding Type: Breast Milk  LATCH Score/Interventions Latch: Too sleepy or reluctant, no latch achieved, no sucking elicited. Intervention(s): Skin to skin;Teach feeding cues;Waking techniques Intervention(s): Adjust position;Assist with latch;Breast massage;Breast compression  Audible Swallowing: None Intervention(s): Skin to skin;Hand expression  Type of Nipple: Flat Intervention(s): Shells;Hand pump  Comfort (Breast/Nipple): Soft / non-tender     Hold (Positioning): Full assist, staff holds infant at breast Intervention(s): Breastfeeding basics reviewed;Support Pillows;Position options;Skin to skin  LATCH Score: 3  Lactation Tools Discussed/Used Tools: Shells;Pump Shell Type: Inverted Breast pump type: Manual Pump Review: Setup, frequency, and cleaning;Milk Storage Initiated by:: Peri JeffersonL. Dakayla Disanti RN Date initiated:: 04/17/16   Consult Status Consult Status: Follow-up Date: 04/17/16 Follow-up type:  In-patient    Orlandus Borowski, Diamond NickelLAURA G 04/17/2016, 3:20 AM

## 2016-04-17 NOTE — Progress Notes (Signed)
Subjective: Postpartum Day 1: Cesarean Delivery Patient reports tolerating PO, + flatus and no problems voiding.    Objective: Vital signs in last 24 hours: Temp:  [97.4 F (36.3 C)-98 F (36.7 C)] 97.7 F (36.5 C) (05/23 0603) Pulse Rate:  [59-87] 67 (05/23 0603) Resp:  [17-29] 20 (05/23 0603) BP: (101-134)/(52-86) 101/53 mmHg (05/23 0603) SpO2:  [96 %-100 %] 96 % (05/23 0603)  Physical Exam:  General: alert, cooperative and no distress Lochia: appropriate Uterine Fundus: firm Incision: clean, dry, intact  DVT Evaluation: No evidence of DVT seen on physical exam.   Recent Labs  04/16/16 0855 04/17/16 0504  HGB 12.4 9.1*  HCT 36.7 27.1*    Assessment/Plan: Status post Cesarean section. Doing well postoperatively.  Continue current care Iron tabs for anemia.  Thomas B Finan CenterKULWA,Chananya Canizalez [facility] Suit[facility] LLCWAKURU 04/17/2016, 10:57 AM

## 2016-04-17 NOTE — Progress Notes (Signed)
CSW received consult for hx of Anx/Dep.  CSW completed chart review.  No current concerns noted during prenatal course.  Due to limited staffing, CSW is unable to meet with MOB at this time.  Please call CSW by MOB's request or if acute concerns are noted. 

## 2016-04-18 LAB — T3, FREE: T3, Free: 2.6 pg/mL (ref 2.0–4.4)

## 2016-04-18 NOTE — Lactation Note (Signed)
This note was copied from a baby's chart. Lactation Consultation Note Baby 5lbs 12 oz. Mom has tubular breast w/flat nipple at the bottle of breast. Mom has NS. Taught "C" hold of the breast to hold up the nipple for breast support. Encouraged to firm breast as well for good latching on NS. Noted colostrum transfer in BS. Moms breast getting slightly heavy, hand expression easy flow colostrum. FOB very attentive. Encouraged to remind mom to wear shells in bra today. Encouraged to post pump for stimulation and supplement w/colostrum. Asked mom for teach back  [facility], mom had difficulty or slow to learn. Needs multiple reminders. FOB would remind her of things about positioning of BF. Mom could possibly tired, LC noticed some of this same behavior prior evening. Reported to RN.  Patient Name: Heidi Ford ZOXWR'UToday's Date: 04/18/2016 Reason for consult: Follow-up assessment;Infant weight loss;Infant < 6lbs   Maternal Data    Feeding Feeding Type: Breast Fed Length of feed: 15 min (still BF)  LATCH Score/Interventions Latch: Repeated attempts needed to sustain latch, nipple held in mouth throughout feeding, stimulation needed to elicit sucking reflex. Intervention(s): Skin to skin;Teach feeding cues;Waking techniques Intervention(s): Adjust position;Assist with latch;Breast massage;Breast compression  Audible Swallowing: Spontaneous and intermittent Intervention(s): Hand expression;Skin to skin  Type of Nipple: Flat Intervention(s): Shells;Double electric pump  Comfort (Breast/Nipple): Soft / non-tender     Hold (Positioning): Assistance needed to correctly position infant at breast and maintain latch. Intervention(s): Position options;Support Pillows;Breastfeeding basics reviewed;Skin to skin  LATCH Score: 7  Lactation Tools Discussed/Used Tools: Shells;Nipple Dorris CarnesShields;Pump Shell Type: Inverted Breast pump type: Double-Electric Breast Pump   Consult  Status Consult Status: Follow-up Date: 04/18/16 Follow-up type: In-patient    Charyl DancerCARVER, Heidi Ford 04/18/2016, 6:13 AM

## 2016-04-18 NOTE — Lactation Note (Signed)
This note was copied from a baby's chart. Lactation Consultation Note; Mom reports baby has been feeding well with a syringe and EBM. Mom with transitional milk pumped at bedside. Reports baby had 18 ml about 1 hour ago and is asleep on mom's chest at present. Encouraged to call for assist with latch at next feeding. No questions at present.   Patient Name: Girl Beckie SaltsSimone Pirani YQMVH'QToday's Date: 04/18/2016 Reason for consult: Follow-up assessment   Maternal Data Formula Feeding for Exclusion: No Has patient been taught Hand Expression?: Yes Does the patient have breastfeeding experience prior to this delivery?: No  Feeding Feeding Type: Breast Milk  LATCH Score/Interventions                      Lactation Tools Discussed/Used     Consult Status Consult Status: Follow-up Date: 04/18/16 Follow-up type: In-patient    Pamelia HoitWeeks, Folashade Gamboa D 04/18/2016, 3:06 PM

## 2016-04-18 NOTE — Progress Notes (Signed)
Subjective: Postpartum Day 2: Cesarean Delivery Patient reports tolerating PO, + flatus and no problems voiding.    Objective: Vital signs in last 24 hours: Temp:  [98.1 F (36.7 C)-98.7 F (37.1 C)] 98.7 F (37.1 C) (05/24 0658) Pulse Rate:  [78-82] 82 (05/24 0658) Resp:  [18] 18 (05/24 0658) BP: (112-122)/(50-63) 115/55 mmHg (05/24 0658) SpO2:  [100 %] 100 % (05/23 1806)  Physical Exam:  General: alert and no distress Lochia: appropriate Uterine Fundus: firm Incision: dressing c/d/i DVT Evaluation: No evidence of DVT seen on physical exam.   Recent Labs  04/16/16 0855 04/17/16 0504  HGB 12.4 9.1*  HCT 36.7 27.1*    Assessment/Plan: Status post Cesarean section. Doing well postoperatively.  Continue current care Plan discharge tomorrow Pt trying to breastfeed  Purcell NailsROBERTS,Saxton Chain Y 04/18/2016, 10:43 AM

## 2016-04-19 ENCOUNTER — Ambulatory Visit: Payer: Self-pay

## 2016-04-19 MED ORDER — OXYCODONE-ACETAMINOPHEN 5-325 MG PO TABS: 1.0000 | ORAL_TABLET | ORAL | Status: DC | PRN

## 2016-04-19 MED ORDER — FERROUS SULFATE 325 (65 FE) MG PO TABS: 325.0000 mg | ORAL_TABLET | Freq: Two times a day (BID) | ORAL | Status: DC

## 2016-04-19 MED ORDER — IBUPROFEN 100 MG/5ML PO SUSP: 600.0000 mg | Freq: Four times a day (QID) | ORAL | Status: DC

## 2016-04-19 NOTE — Discharge Summary (Signed)
Obstetric Discharge Summary  Reason for Admission: onset of labor on 04/16/16 Prenatal Procedures: none Intrapartum Procedures: cesarean: low cervical, transverse by Dr Normand Sloopillard on 04/16/16 for fetal intolerance to labor Postpartum Procedures: none Complications-Operative and Postpartum: none  HEMOGLOBIN  Date Value Ref Range Status  04/17/2016 9.1* 12.0 - 15.0 g/dL Final    Comment:    DELTA CHECK NOTED REPEATED TO VERIFY    HCT  Date Value Ref Range Status  04/17/2016 27.1* 36.0 - 46.0 % Final    Discharge Diagnoses: Term Pregnancy-delivered  Hospital course: uncomplicated  Date: 04/19/2016 Activity: unrestricted Diet: routine Medications: Ibuprofen and Percocet Condition: stable  Breastfeeding:   Yes.   Contraception:  *Mirena  Instructions: refer to practice specific booklet Discharge to: home   Newborn Data:   Baby female Name: Shawnie Ponsyla  Jamari Diana A MD 04/19/2016, 7:33 AM

## 2016-04-19 NOTE — Lactation Note (Signed)
This note was copied from a baby's chart. Lactation Consultation Note  Patient Name: Girl Beckie SaltsSimone Weddington ZOXWR'UToday's Date: 04/19/2016 Reason for consult: Follow-up assessment;Difficult latch  Mom scheduled a WIC appt for tomorrow at 1 pm [facility] Mom recently pumped 3 oz EBM. Mom to call with questions/concerns prn  Maternal Data Formula Feeding for Exclusion: No  Feeding    LATCH Score/Interventions                      Lactation Tools Discussed/Used WIC Program: Yes Pump Review: Setup, frequency, and cleaning;Milk Storage   Consult Status Consult Status: Follow-up Date: 04/26/16 Follow-up type: Out-patient    Silas FloodSharon S Caylon Saine 04/19/2016, 1:54 PM

## 2016-04-19 NOTE — Progress Notes (Signed)
Subjective: Postpartum Day 3: Cesarean Delivery Patient reports that pain is well-managed.Lochia normal.  Ambulating, voiding, tolerating diet as ordered without difficulty. Normal flatus. Absent bowel movement.  Objective: Vital signs in last 24 hours: Temp:  [98.2 F (36.8 C)] 98.2 F (36.8 C) (05/25 0554) Pulse Rate:  [66-94] 66 (05/25 0554) Resp:  [18] 18 (05/25 0554) BP: (114-120)/(61-67) 114/67 mmHg (05/25 0554) SpO2:  [100 %] 100 % (05/24 1832)  Physical Exam:  General: alert Lochia: appropriate Uterine Fundus: firm and appropriately tender Incision: healing well DVT Evaluation: No evidence of DVT seen on physical exam. Edema minimal   Recent Labs  04/16/16 0855 04/17/16 0504  HGB 12.4 9.1*  HCT 36.7 27.1*    Assessment/Plan: Status post Cesarean section. Doing well postoperatively.   discharge today D/C instructions reviewed  Edgardo Petrenko A 04/19/2016, 7:33 AM

## 2016-04-19 NOTE — Discharge Instructions (Signed)
Cesarean Delivery Cesarean delivery is the birth of a baby through a cut (incision) in the abdomen and womb (uterus).  LET YOUR HEALTH CARE PROVIDER KNOW ABOUT:  All medicines you are taking, including vitamins, herbs, eye drops, creams, and over-the-counter medicines.  Previous problems you or members of your family have had with the use of anesthetics.  Any bleeding or blood clotting disorders you have.  Family history of blood clots or bleeding disorders.  Any history of deep vein thrombosis (DVT) or pulmonary embolism (PE).  Previous surgeries you have had.  Medical conditions you have.  Any allergies you have.  Complicationsinvolving the pregnancy. RISKS AND COMPLICATIONS  Generally, this is a safe procedure. However, as with any procedure, complications can occur. Possible complications include:  Bleeding.  Infection.  Blood clots.  Injury to surrounding organs.  Problems with anesthesia.  Injury to the baby. BEFORE THE PROCEDURE   You may be given an antacid medicine to drink. This will prevent acid contents in your stomach from going into your lungs if you vomit during the surgery.  You may be given an antibiotic medicine to prevent infection. PROCEDURE   To prevent infection of your incision:  Hair may be removed from your pubic area if it is near your incision.  The skin of your pubic area and lower abdomen will be cleaned with a germ-killing solution (antiseptic).  A tube (Foley catheter) will be placed in your bladder to drain your urine from your bladder into a bag. This keeps your bladder empty during surgery.  An IV tube will be placed in your vein.  You may be given medicine to numb the lower half of your body (regional anesthetic). If you were in labor, you may have already had an epidural in place which can be used in both labor and cesarean delivery. You may possibly be given medicine to make you sleep (general anesthetic) though this is not as  common.  Your heart rate and your baby's heart rate will be monitored.  An incision will be made in your abdomen that extends to your uterus. There are 2 basic kinds of incisions:  The horizontal (transverse) incision. Horizontal incisions are from side to side and are used for most routine cesarean deliveries.  The vertical incision. The vertical incision is from the top of the abdomen to the bottom and is less commonly used. It is often done for women who have a serious complication (extreme prematurity) or under emergency situations.  The horizontal and vertical incisions may both be used at the same time. However, this is very uncommon.  An incision is then made in your uterus to deliver the baby.  Your baby will be delivered.  Your health care provider may place the baby on your chest. It is important to keep the baby warm. Your health care provider will dry off the baby, place the baby directly on your bare skin, and cover the baby with warm, dry blankets.  Both incisions will be closed with absorbable stitches. AFTER THE PROCEDURE   If you were awake during the surgery, you will see your baby right away. If you were asleep, you will see your baby as soon as you are awake.  You may breastfeed your baby after surgery.  You may be able to get up and walk the same day as the surgery. If you need to stay in bed for a period of time, you will receive help to turn, cough, and take deep breaths after   surgery. This helps prevent lung problems such as pneumonia.  Do not get out of bed alone the first time after surgery. You will need help getting out of bed until you are able to do this by yourself.  You may be able to shower the day after your cesarean delivery. After the bandage (dressing) is taken off the incision site, a nurse will assist you to shower if you would like help.  You may be directed to take actions to help prevent blood clots in your legs. These may  include:  Walking shortly after surgery, with someone assisting you. Moving around after surgery helps to improve blood flow.  Wearing compression stockings or using different types of devices.  Taking medicines to thin your blood (anticoagulants) if you are at high risk for DVT or PE.  Save any blood clots that you pass from your vagina. If you pass a clot while on the toilet, do not flush it. Call for the nurse. Tell the nurse if you think you are bleeding too much or passing too many clots.  You will be given medicine for pain and nausea as needed. Let your health care providers know if you are hurting. You may also be given an antibiotic to prevent an infection.  Your IV tube will be taken out when you are drinking a reasonable amount of fluids. The Foley catheter is taken out when you are up and walking.  If your blood type is Rh negative and your baby's blood type is Rh positive, you will be given a shot of anti-D immune globulin. This shot prevents you from having Rh problems with a future pregnancy. You should get the shot even if you had your tubes tied (tubal ligation).  If you are allowed to take the baby for a walk, place the baby in the bassinet and push it.   This information is not intended to replace advice given to you by your health care provider. Make sure you discuss any questions you have with your health care provider.   Document Released: 11/12/2005 Document Revised: 08/03/2015 Document Reviewed: 07/09/2012 Elsevier Interactive Patient Education 2016 [employer] Inc[employer]  

## 2016-04-19 NOTE — Lactation Note (Signed)
This note was copied from a baby's chart. Lactation Consultation Note  Patient Name: Heidi Ford WUJWJ'XToday's Date: 04/19/2016 Reason for consult: Follow-up assessment;Difficult latch  Follow up with mom of 71 hour infant. Infant is going to breast using #24 NS and being bottle fed EBM. Infant weight 5 lb 11.2 oz with weight loss of 6% since birth. Mom reports infant is feeding well. She reports that if she does not BF, she is supplementing with EBM.   Mom is pumping every 2- 3 hours with DEBP and getting 30+ EBM each pumping. Mom reports breasts are fuller today, she is not engorged this am. Reviewed engorgement prevention/treatment with mom . [facility] and [facility]. Mom has to bring infant back tomorrow for repeat hearing screen, [facility]. She has paperwork to fill out prior to leaving hospital. Showed mom how to manually express with Single manual and double manual pump and stressed that feeding infant and  pumping every 2-3 hours is recommended to prevent engorgement.   Mom has my number to call [facility] regards [facility]. [facility] reviewed, mom aware of BF Support Groups and LC phone #. OP appt made for 04/26/16 @ 10:30. [facility]   Reviewed all BF information in Taking Care of Baby in Me Booklet. Reviewed I/O. Infant with f/u ped appt tomorrow.   Maternal Data Formula Feeding for Exclusion: No  Feeding Feeding Type: Bottle Fed - Breast Milk  LATCH Score/Interventions                      Lactation Tools Discussed/Used WIC Program: Yes Pump Review: Setup, frequency, and cleaning;Milk Storage   Consult Status Consult Status: Follow-up Date: 04/26/16 Follow-up type: Out-patient    Silas FloodSharon S Lucile Hillmann 04/19/2016, 11:49 AM

## 2016-04-19 NOTE — Lactation Note (Signed)
This note was copied from a baby's chart. Lactation Consultation Note Having trouble latching baby. Mom has been supplementing w/BM via syring. Mom has #24 NS. Breast are filling, NS will not stay on nipples d/t curve of nipples at this time. Mom has flat nipples. Used DEBP 35 ml from Lt. Breast gave to baby in bottle w/slow flow nipple, pumped 30ml from Rt. Breast. Took a lot of stimulation to get baby to suck on bottle. gaged frequently. Finally completed feeding. Mom stated she is going to pump and bottle feed. She wants baby to have breast milk but decided that it has been a struggle at the breast and it would be less stressful and prefers to pump and bottle feed. Has manual pump and family is buying her a DEBP . Discussed supply and demand, milk storage, pumping q3 hrs.engorgement prevention. Reported to RN. Patient Name: Heidi Ford ZOXWR'UToday's Date: 04/19/2016 Reason for consult: Follow-up assessment;Difficult latch;Infant < 6lbs   Maternal Data    Feeding Feeding Type: Breast Milk  LATCH Score/Interventions Latch: Too sleepy or reluctant, no latch achieved, no sucking elicited. Intervention(s): Skin to skin;Teach feeding cues;Waking techniques Intervention(s): Adjust position;Assist with latch;Breast massage;Breast compression  Intervention(s): Skin to skin;Hand expression  Type of Nipple: Flat Intervention(s): Double electric pump  Comfort (Breast/Nipple): Filling, red/small blisters or bruises, mild/mod discomfort  Problem noted: Filling Interventions (Filling): Double electric pump;Massage  Hold (Positioning): Assistance needed to correctly position infant at breast and maintain latch. Intervention(s): Support Pillows;Position options;Breastfeeding basics reviewed     Lactation Tools Discussed/Used Tools: Pump Breast pump type: Double-Electric Breast Pump   Consult Status Consult Status: Complete Date: 04/19/16    Charyl DancerCARVER, [name] 04/19/2016, 6:46 AM

## 2016-04-26 ENCOUNTER — Inpatient Hospital Stay (HOSPITAL_COMMUNITY): Admit: 2016-04-26 | Payer: Federal, State, Local not specified - PPO

## 2016-05-07 ENCOUNTER — Encounter (HOSPITAL_COMMUNITY): Payer: Self-pay

## 2016-05-07 ENCOUNTER — Encounter (HOSPITAL_COMMUNITY): Payer: Self-pay | Admitting: *Deleted

## 2016-05-07 ENCOUNTER — Ambulatory Visit (HOSPITAL_COMMUNITY)
Admit: 2016-05-07 | Discharge: 2016-05-07 | Disposition: A | Discharge status: Home / Self Care | Payer: Medicaid Other | Attending: Obstetrics and Gynecology | Admitting: Obstetrics and Gynecology

## 2016-05-07 ENCOUNTER — Inpatient Hospital Stay (HOSPITAL_COMMUNITY)
Admission: AD | Admit: 2016-05-07 | Discharge: 2016-05-12 | DRG: 781 | Disposition: A | Discharge status: Home / Self Care | Payer: Medicaid Other | Source: Ambulatory Visit | Attending: Obstetrics & Gynecology | Admitting: Obstetrics & Gynecology

## 2016-05-07 DIAGNOSIS — O9081 Anemia of the puerperium: Secondary | ICD-10-CM | POA: Diagnosis present

## 2016-05-07 DIAGNOSIS — D649 Anemia, unspecified: Secondary | ICD-10-CM | POA: Diagnosis present

## 2016-05-07 DIAGNOSIS — R7881 Bacteremia: Secondary | ICD-10-CM | POA: Diagnosis present

## 2016-05-07 DIAGNOSIS — O8689 Other specified puerperal infections: Secondary | ICD-10-CM | POA: Diagnosis not present

## 2016-05-07 DIAGNOSIS — R1031 Right lower quadrant pain: Secondary | ICD-10-CM | POA: Diagnosis present

## 2016-05-07 DIAGNOSIS — O9989 Other specified diseases and conditions complicating pregnancy, childbirth and the puerperium: Principal | ICD-10-CM | POA: Diagnosis present

## 2016-05-07 DIAGNOSIS — O85 Puerperal sepsis: Secondary | ICD-10-CM | POA: Diagnosis present

## 2016-05-07 DIAGNOSIS — B9689 Other specified bacterial agents as the cause of diseases classified elsewhere: Secondary | ICD-10-CM | POA: Diagnosis not present

## 2016-05-07 DIAGNOSIS — N739 Female pelvic inflammatory disease, unspecified: Secondary | ICD-10-CM | POA: Diagnosis present

## 2016-05-07 DIAGNOSIS — O860 Infection of obstetric surgical wound: Secondary | ICD-10-CM | POA: Diagnosis not present

## 2016-05-07 DIAGNOSIS — N732 Unspecified parametritis and pelvic cellulitis: Secondary | ICD-10-CM | POA: Diagnosis present

## 2016-05-07 HISTORY — DX: Thyrotoxicosis, unspecified without thyrotoxic crisis or storm: E05.90

## 2016-05-07 HISTORY — DX: Major depressive disorder, single episode, unspecified: F32.9

## 2016-05-07 HISTORY — DX: Depression, unspecified: F32.A

## 2016-05-07 LAB — CBC WITH DIFFERENTIAL/PLATELET
BASOS ABS: 0 10*3/uL (ref 0.0–0.1)
BASOS PCT: 0 %
EOS ABS: 0.1 10*3/uL (ref 0.0–0.7)
Eosinophils Relative: 1 %
HEMATOCRIT: 29.6 % — AB (ref 36.0–46.0)
Hemoglobin: 9.5 g/dL — ABNORMAL LOW (ref 12.0–15.0)
Lymphocytes Relative: 8 %
Lymphs Abs: 1 10*3/uL (ref 0.7–4.0)
MCH: 28.2 pg (ref 26.0–34.0)
MCHC: 32.1 g/dL (ref 30.0–36.0)
MCV: 87.8 fL (ref 78.0–100.0)
MONOS PCT: 6 %
Monocytes Absolute: 0.7 10*3/uL (ref 0.1–1.0)
NEUTROS ABS: 10.7 10*3/uL — AB (ref 1.7–7.7)
NEUTROS PCT: 85 %
Platelets: 336 10*3/uL (ref 150–400)
RBC: 3.37 MIL/uL — ABNORMAL LOW (ref 3.87–5.11)
RDW: 13.3 % (ref 11.5–15.5)
WBC: 12.6 10*3/uL — AB (ref 4.0–10.5)

## 2016-05-07 LAB — COMPREHENSIVE METABOLIC PANEL
ALBUMIN: 3 g/dL — AB (ref 3.5–5.0)
ALK PHOS: 94 U/L (ref 38–126)
ALT: 9 U/L — ABNORMAL LOW (ref 14–54)
ANION GAP: 8 (ref 5–15)
AST: 11 U/L — AB (ref 15–41)
BILIRUBIN TOTAL: 1 mg/dL (ref 0.3–1.2)
BUN: 8 mg/dL (ref 6–20)
CO2: 24 mmol/L (ref 22–32)
Calcium: 8.7 mg/dL — ABNORMAL LOW (ref 8.9–10.3)
Chloride: 105 mmol/L (ref 101–111)
Creatinine, Ser: 0.57 mg/dL (ref 0.44–1.00)
GFR calc Af Amer: >60 mL/min (ref >60)
GFR calc non Af Amer: >60 mL/min (ref >60)
GLUCOSE: 108 mg/dL — AB (ref 65–99)
POTASSIUM: 3.3 mmol/L — AB (ref 3.5–5.1)
SODIUM: 137 mmol/L (ref 135–145)
Total Protein: 7.1 g/dL (ref 6.5–8.1)

## 2016-05-07 LAB — URINALYSIS, ROUTINE W REFLEX MICROSCOPIC
Glucose, UA: NEGATIVE mg/dL
Hgb urine dipstick: NEGATIVE
KETONES UR: 15 mg/dL — AB
LEUKOCYTES UA: NEGATIVE
NITRITE: NEGATIVE
PROTEIN: 30 mg/dL — AB
Specific Gravity, Urine: 1.025 (ref 1.005–1.030)
pH: 5.5 (ref 5.0–8.0)

## 2016-05-07 LAB — URINE MICROSCOPIC-ADD ON

## 2016-05-07 LAB — POCT PREGNANCY, URINE: PREG TEST UR: POSITIVE — AB

## 2016-05-07 LAB — HCG, QUANTITATIVE, PREGNANCY: hCG, Beta Chain, Quant, S: 2 m[IU]/mL (ref <5)

## 2016-05-07 MED ORDER — IOPAMIDOL (ISOVUE-300) INJECTION 61%
100.0000 mL | Freq: Once | INTRAVENOUS | Status: AC | PRN
  Administered 2016-05-07: 100 mL via INTRAVENOUS

## 2016-05-07 MED ORDER — HYDROMORPHONE HCL 2 MG PO TABS
2.0000 mg | ORAL_TABLET | ORAL | Status: DC | PRN
  Administered 2016-05-07 – 2016-05-12 (×19): 2 mg via ORAL
  Filled 2016-05-07 (×21): qty 1

## 2016-05-07 MED ORDER — DIATRIZOATE MEGLUMINE & SODIUM 66-10 % PO SOLN
30.0000 mL | Freq: Once | ORAL | Status: DC
  Filled 2016-05-07: qty 30

## 2016-05-07 MED ORDER — ACETAMINOPHEN 500 MG PO TABS
1000.0000 mg | ORAL_TABLET | Freq: Four times a day (QID) | ORAL | Status: DC | PRN
  Administered 2016-05-07: 1000 mg via ORAL
  Filled 2016-05-07: qty 2

## 2016-05-07 MED ORDER — ACETAMINOPHEN 160 MG/5ML PO SOLN
1000.0000 mg | Freq: Four times a day (QID) | ORAL | Status: DC | PRN
  Administered 2016-05-08 – 2016-05-11 (×6): 1000 mg via ORAL
  Filled 2016-05-07 (×9): qty 40

## 2016-05-07 MED ORDER — SODIUM CHLORIDE 0.9 % IV SOLN
3.0000 g | Freq: Four times a day (QID) | INTRAVENOUS | Status: DC
  Administered 2016-05-07 – 2016-05-08 (×4): 3 g via INTRAVENOUS
  Filled 2016-05-07 (×6): qty 3

## 2016-05-07 MED ORDER — PRENATAL MULTIVITAMIN CH
1.0000 | ORAL_TABLET | Freq: Every day | ORAL | Status: DC
  Administered 2016-05-08: 1 via ORAL
  Filled 2016-05-07 (×2): qty 1

## 2016-05-07 MED ORDER — IBUPROFEN 800 MG PO TABS
800.0000 mg | ORAL_TABLET | Freq: Three times a day (TID) | ORAL | Status: DC | PRN
  Administered 2016-05-07 – 2016-05-08 (×2): 800 mg via ORAL
  Filled 2016-05-07 (×3): qty 1

## 2016-05-07 MED ORDER — DEXTROSE 5 % IV SOLN
100.0000 mg | Freq: Two times a day (BID) | INTRAVENOUS | Status: DC
  Administered 2016-05-07 – 2016-05-08 (×2): 100 mg via INTRAVENOUS
  Filled 2016-05-07 (×3): qty 100

## 2016-05-07 MED ORDER — HYDROMORPHONE HCL 1 MG/ML IJ SOLN
1.0000 mg | Freq: Once | INTRAMUSCULAR | Status: AC
  Administered 2016-05-07: 1 mg via INTRAMUSCULAR
  Filled 2016-05-07: qty 1

## 2016-05-07 MED ORDER — LACTATED RINGERS IV SOLN
INTRAVENOUS | Status: DC
  Administered 2016-05-08 – 2016-05-12 (×8): via INTRAVENOUS

## 2016-05-07 NOTE — MAU Provider Note (Signed)
History     CSN: [id number]  Arrival date and time: 05/07/16 1009   First Provider Initiated Contact with Patient 05/07/16 1048      Chief Complaint  Patient presents with  . Post-op Problem  . Abdominal Pain   HPI   [name] is a 25 y.o. female G1P1001 status post primary cesarean section here with abdominal pain.  She delivered via C-section on 5/22 by Dr. Charlesetta Garibaldi.  She started having abdominal pain a week after delivery. She went back to her Dr. And was told that her c-section incision was infected and was started on PO antibiotics and she completed those. The pain got better however returned and is now worse that when it started.  The pain is located on her right rip and radiates to her lower abdomen.  She has been taking ibuprofen for pain which is not helping at this time. She rates her pain 8/10 currently.   Eating makes it worse at times and she feels she has gas bubbles in her abdomen.   OB History    Gravida Para Term Preterm AB TAB SAB Ectopic Multiple Living   '1 1 1      '$ 0 1      Past Medical History  Diagnosis Date  . Anxiety   . Childhood asthma     Resolved  . Hyperthyroidism     "over active thyroid"    Past Surgical History  Procedure Laterality Date  . Wisdom tooth extraction    . Cesarean section N/A 04/16/2016    Procedure: CESAREAN SECTION;  Surgeon: Crawford Givens, MD;  Location: [facility];  Service: Obstetrics;  Laterality: N/A;    Family History  Problem Relation Age of Onset  . Healthy Mother     Living  . Healthy Father     Living  . Diabetes Maternal Grandmother   . Diabetes Maternal Aunt   . Healthy Sister     x1  . Asthma Sister     #2-Resolved    Social History  Substance Use Topics  . Smoking status: Never Smoker   . Smokeless tobacco: Never Used  . Alcohol Use: No    Allergies: No Known Allergies  Prescriptions prior to admission  Medication Sig Dispense Refill Last Dose  . ferrous sulfate 325 (65  FE) MG tablet Take 1 tablet (325 mg total) by mouth 2 (two) times daily with a meal. 60 tablet 3 Past Week at Unknown time  . oxyCODONE-acetaminophen (PERCOCET/ROXICET) 5-325 MG tablet Take 1 tablet by mouth every 4 (four) hours as needed (for pain scale 4-7). 30 tablet 0 Past Week at Unknown time  . Prenatal Vit-Fe Fumarate-FA (PRENATAL MULTIVITAMIN) TABS tablet Take 1 tablet by mouth daily at 12 noon.   05/07/2016 at Unknown time  . ibuprofen (ADVIL,MOTRIN) 100 MG/5ML suspension Take 30 mLs (600 mg total) by mouth every 6 (six) hours. (Patient not taking: Reported on 05/07/2016) 473 mL 5 Not Taking at Unknown time   Results for orders placed or performed during the hospital encounter of 05/07/16 (from the past 48 hour(s))  Urinalysis, Routine w reflex microscopic (not at [facility])     Status: Abnormal   Collection Time: 05/07/16 10:25 AM  Result Value Ref Range   Color, Urine YELLOW YELLOW   APPearance CLEAR CLEAR   Specific Gravity, Urine 1.025 1.005 - 1.030   pH 5.5 5.0 - 8.0   Glucose, UA NEGATIVE NEGATIVE mg/dL   Hgb urine dipstick NEGATIVE NEGATIVE  Bilirubin Urine SMALL (A) NEGATIVE   Ketones, ur 15 (A) NEGATIVE mg/dL   Protein, ur 30 (A) NEGATIVE mg/dL   Nitrite NEGATIVE NEGATIVE   Leukocytes, UA NEGATIVE NEGATIVE  Urine microscopic-add on     Status: Abnormal   Collection Time: 05/07/16 10:25 AM  Result Value Ref Range   Squamous Epithelial / LPF 0-5 (A) NONE SEEN   WBC, UA 0-5 0 - 5 WBC/hpf   RBC / HPF 0-5 0 - 5 RBC/hpf   Bacteria, UA MANY (A) NONE SEEN   Urine-Other MUCOUS PRESENT   Pregnancy, urine POC     Status: Abnormal   Collection Time: 05/07/16 10:51 AM  Result Value Ref Range   Preg Test, Ur POSITIVE (A) NEGATIVE    Comment:        THE SENSITIVITY OF THIS METHODOLOGY IS >24 mIU/mL   CBC with Differential     Status: Abnormal   Collection Time: 05/07/16 11:00 AM  Result Value Ref Range   WBC 12.6 (H) 4.0 - 10.5 K/uL   RBC 3.37 (L) 3.87 - 5.11 MIL/uL   Hemoglobin  9.5 (L) 12.0 - 15.0 g/dL   HCT 29.6 (L) 36.0 - 46.0 %   MCV 87.8 78.0 - 100.0 fL   MCH 28.2 26.0 - 34.0 pg   MCHC 32.1 30.0 - 36.0 g/dL   RDW 13.3 11.5 - 15.5 %   Platelets 336 150 - 400 K/uL   Neutrophils Relative % 85 %   Neutro Abs 10.7 (H) 1.7 - 7.7 K/uL   Lymphocytes Relative 8 %   Lymphs Abs 1.0 0.7 - 4.0 K/uL   Monocytes Relative 6 %   Monocytes Absolute 0.7 0.1 - 1.0 K/uL   Eosinophils Relative 1 %   Eosinophils Absolute 0.1 0.0 - 0.7 K/uL   Basophils Relative 0 %   Basophils Absolute 0.0 0.0 - 0.1 K/uL  Comprehensive metabolic panel     Status: Abnormal   Collection Time: 05/07/16 11:00 AM  Result Value Ref Range   Sodium 137 135 - 145 mmol/L   Potassium 3.3 (L) 3.5 - 5.1 mmol/L   Chloride 105 101 - 111 mmol/L   CO2 24 22 - 32 mmol/L   Glucose, Bld 108 (H) 65 - 99 mg/dL   BUN 8 6 - 20 mg/dL   Creatinine, Ser 0.57 0.44 - 1.00 mg/dL   Calcium 8.7 (L) 8.9 - 10.3 mg/dL   Total Protein 7.1 6.5 - 8.1 g/dL   Albumin 3.0 (L) 3.5 - 5.0 g/dL   AST 11 (L) 15 - 41 U/L   ALT 9 (L) 14 - 54 U/L   Alkaline Phosphatase 94 38 - 126 U/L   Total Bilirubin 1.0 0.3 - 1.2 mg/dL   GFR calc non Af Amer >60 >60 mL/min   GFR calc Af Amer >60 >60 mL/min    Comment: (NOTE) The eGFR has been calculated using the CKD EPI equation. This calculation has not been validated in all clinical situations. eGFR's persistently <60 mL/min signify possible Chronic Kidney Disease.    Anion gap 8 5 - 15  hCG, quantitative, pregnancy     Status: None   Collection Time: 05/07/16 11:00 AM  Result Value Ref Range   hCG, Beta Chain, Quant, S 2 <5 mIU/mL    Comment:          GEST. AGE      CONC.  (mIU/mL)   <=1 WEEK        5 - 50  2 WEEKS       50 - 500     3 WEEKS       100 - 10,000     4 WEEKS     1,000 - 30,000     5 WEEKS     3,500 - 115,000   6-8 WEEKS     12,000 - 270,000    12 WEEKS     15,000 - 220,000        FEMALE AND NON-PREGNANT FEMALE:     LESS THAN 5 mIU/mL     Review of Systems   Constitutional: Negative for fever and chills.  Gastrointestinal: Positive for abdominal pain and constipation (3 x per week, + hard stools. ). Negative for heartburn, nausea, vomiting and diarrhea.  Genitourinary: Negative for dysuria and urgency.   Physical Exam   Blood pressure 114/61, pulse 95, temperature 98.5 F (36.9 C), resp. rate 18, not currently breastfeeding.  Physical Exam  Constitutional: She is oriented to person, place, and time. She appears well-developed and well-nourished. She appears ill. She appears distressed.  Eyes: Pupils are equal, round, and reactive to light.  Cardiovascular: Normal rate and normal heart sounds.   Respiratory: Effort normal.  GI: Bowel sounds are normal. There is tenderness in the right upper quadrant, right lower quadrant, periumbilical area and suprapubic area. There is rigidity and guarding. There is no rebound, no tenderness at McBurney's point and negative Murphy's sign.  Musculoskeletal: Normal range of motion.  Neurological: She is alert and oriented to person, place, and time.  Skin: Skin is warm.  Psychiatric: Her behavior is normal.    MAU Course  Procedures  None   MDM  CBC  CMP UA  IV dilaudid 1 mg  Consulted with [facility]; CT scan ordered- CT scan [facility] at [facility], patient will go to [facility] and return to [employer]. Patient aware and agreeable to plan of care.   Assessment and Plan   A:  1. Postpartum pelvic abscess     P:  Admit to 3rd floor per Dr. Maryjane Hurter I Doron Shake, NP 05/07/2016 7:55 PM

## 2016-05-07 NOTE — MAU Note (Signed)
Not straining with bowel movements, but not having as frequently.  Pain seemed worse after eating yesterday

## 2016-05-07 NOTE — H&P (Addendum)
Heidi Ford is an 25 y.o. female POD # 21 after a primary cesarean section for fetal intolerance of labor who presented with complaints of right sided abdominal pain present for over 1 week that did not improve with use of ibuprofen and percocet.  Patient was evaluated in office on 6/5 with similar complaints and given keflex and local anesthetic patch which did not help with symptoms.  Pain is mostly in right lower abdomen, intermittent sharp pain, was 8/10 intensity and after dilaudid IV it improved now is 4/10 intensity.  She denies feeling fevers or chills.  Bottle feeding her baby only, denies any breast complaints.  Pertinent Gynecological History: Bleeding: Small amount of lochia.  Contraception: none and she has not resumed intercourse since delivery.  Sexually transmitted diseases: no past history Previous GYN Procedures: none  OB History: G1, P1001    Past Medical History  Diagnosis Date  . Anxiety   . Childhood asthma     Resolved  . Hyperthyroidism     "over active thyroid"    Past Surgical History  Procedure Laterality Date  . Wisdom tooth extraction    . Cesarean section N/A 04/16/2016    Procedure: CESAREAN SECTION;  Surgeon: Jaymes Graff, MD;  Location: [facility];  Service: Obstetrics;  Laterality: N/A;    Family History  Problem Relation Age of Onset  . Healthy Mother     Living  . Healthy Father     Living  . Diabetes Maternal Grandmother   . Diabetes Maternal Aunt   . Healthy Sister     x1  . Asthma Sister     #2-Resolved    Social History:  reports that she has never smoked. She has never used smokeless tobacco. She reports that she does not drink alcohol or use illicit drugs.  Allergies: No Known Allergies  Prescriptions prior to admission  Medication Sig Dispense Refill Last Dose  . ferrous sulfate 325 (65 FE) MG tablet Take 1 tablet (325 mg total) by mouth 2 (two) times daily with a meal. 60 tablet 3 Past Week at Unknown time  .  oxyCODONE-acetaminophen (PERCOCET/ROXICET) 5-325 MG tablet Take 1 tablet by mouth every 4 (four) hours as needed (for pain scale 4-7). 30 tablet 0 Past Week at Unknown time  . Prenatal Vit-Fe Fumarate-FA (PRENATAL MULTIVITAMIN) TABS tablet Take 1 tablet by mouth daily at 12 noon.   05/07/2016 at Unknown time  . ibuprofen (ADVIL,MOTRIN) 100 MG/5ML suspension Take 30 mLs (600 mg total) by mouth every 6 (six) hours. (Patient not taking: Reported on 05/07/2016) 473 mL 5 Not Taking at Unknown time    Review of Systems  Constitutional: Positive for malaise/fatigue. Negative for fever and chills.  HENT: Negative.  Negative for congestion, ear discharge, ear pain, hearing loss, sore throat and tinnitus.   Eyes: Negative.   Respiratory: Negative.  Negative for cough, hemoptysis, sputum production, shortness of breath and wheezing.   Cardiovascular: Negative.  Negative for chest pain, palpitations, orthopnea and leg swelling.  Gastrointestinal: Positive for abdominal pain and constipation. Negative for heartburn, nausea, vomiting, diarrhea, blood in stool and melena.  Genitourinary: Positive for dysuria. Negative for urgency, frequency, hematuria and flank pain.  Musculoskeletal: Negative.  Negative for myalgias, back pain, joint pain, falls and neck pain.  Skin: Negative.   Neurological: Negative.  Negative for headaches.  Endo/Heme/Allergies: Negative.   Psychiatric/Behavioral: Negative.  Negative for depression, suicidal ideas, hallucinations, memory loss and substance abuse. The patient is not nervous/anxious and does  not have insomnia.     Blood pressure 137/70, pulse 94, temperature 103 F (39.4 C), temperature source Axillary, resp. rate 18, SpO2 100 %, not currently breastfeeding. Physical Exam  Vitals reviewed. Constitutional: She is oriented to person, place, and time. She appears well-developed and well-nourished.  HENT:  Head: Normocephalic.  Neck: Normal range of motion.  Cardiovascular:  Normal rate, regular rhythm and normal heart sounds.   Respiratory: Effort normal and breath sounds normal.  GI: Soft. Bowel sounds are normal. She exhibits no mass. There is tenderness. There is no rebound and no guarding.  Genitourinary: Vagina normal and uterus normal. No vaginal discharge found.  Musculoskeletal: Normal range of motion. She exhibits no edema or tenderness.  Neurological: She is alert and oriented to person, place, and time.  Skin: Skin is warm.  Psychiatric: She has a normal mood and affect. Her behavior is normal. Judgment and thought content normal.    Results for orders placed or performed during the hospital encounter of 05/07/16 (from the past 24 hour(s))  Urinalysis, Routine w reflex microscopic (not at [facility])     Status: Abnormal   Collection Time: 05/07/16 10:25 AM  Result Value Ref Range   Color, Urine YELLOW YELLOW   APPearance CLEAR CLEAR   Specific Gravity, Urine 1.025 1.005 - 1.030   pH 5.5 5.0 - 8.0   Glucose, UA NEGATIVE NEGATIVE mg/dL   Hgb urine dipstick NEGATIVE NEGATIVE   Bilirubin Urine SMALL (A) NEGATIVE   Ketones, ur 15 (A) NEGATIVE mg/dL   Protein, ur 30 (A) NEGATIVE mg/dL   Nitrite NEGATIVE NEGATIVE   Leukocytes, UA NEGATIVE NEGATIVE  Urine microscopic-add on     Status: Abnormal   Collection Time: 05/07/16 10:25 AM  Result Value Ref Range   Squamous Epithelial / LPF 0-5 (A) NONE SEEN   WBC, UA 0-5 0 - 5 WBC/hpf   RBC / HPF 0-5 0 - 5 RBC/hpf   Bacteria, UA MANY (A) NONE SEEN   Urine-Other MUCOUS PRESENT   Pregnancy, urine POC     Status: Abnormal   Collection Time: 05/07/16 10:51 AM  Result Value Ref Range   Preg Test, Ur POSITIVE (A) NEGATIVE  CBC with Differential     Status: Abnormal   Collection Time: 05/07/16 11:00 AM  Result Value Ref Range   WBC 12.6 (H) 4.0 - 10.5 K/uL   RBC 3.37 (L) 3.87 - 5.11 MIL/uL   Hemoglobin 9.5 (L) 12.0 - 15.0 g/dL   HCT 16.1 (L) 09.6 - 04.5 %   MCV 87.8 78.0 - 100.0 fL   MCH 28.2 26.0 - 34.0 pg    MCHC 32.1 30.0 - 36.0 g/dL   RDW 40.9 81.1 - 91.4 %   Platelets 336 150 - 400 K/uL   Neutrophils Relative % 85 %   Neutro Abs 10.7 (H) 1.7 - 7.7 K/uL   Lymphocytes Relative 8 %   Lymphs Abs 1.0 0.7 - 4.0 K/uL   Monocytes Relative 6 %   Monocytes Absolute 0.7 0.1 - 1.0 K/uL   Eosinophils Relative 1 %   Eosinophils Absolute 0.1 0.0 - 0.7 K/uL   Basophils Relative 0 %   Basophils Absolute 0.0 0.0 - 0.1 K/uL  Comprehensive metabolic panel     Status: Abnormal   Collection Time: 05/07/16 11:00 AM  Result Value Ref Range   Sodium 137 135 - 145 mmol/L   Potassium 3.3 (L) 3.5 - 5.1 mmol/L   Chloride 105 101 - 111 mmol/L  CO2 24 22 - 32 mmol/L   Glucose, Bld 108 (H) 65 - 99 mg/dL   BUN 8 6 - 20 mg/dL   Creatinine, Ser 4.090.57 0.44 - 1.00 mg/dL   Calcium 8.7 (L) 8.9 - 10.3 mg/dL   Total Protein 7.1 6.5 - 8.1 g/dL   Albumin 3.0 (L) 3.5 - 5.0 g/dL   AST 11 (L) 15 - 41 U/L   ALT 9 (L) 14 - 54 U/L   Alkaline Phosphatase 94 38 - 126 U/L   Total Bilirubin 1.0 0.3 - 1.2 mg/dL   GFR calc non Af Amer >60 >60 mL/min   GFR calc Af Amer >60 >60 mL/min   Anion gap 8 5 - 15  hCG, quantitative, pregnancy     Status: None   Collection Time: 05/07/16 11:00 AM  Result Value Ref Range   hCG, Beta Chain, Quant, S 2 <5 mIU/mL    Ct Abdomen Pelvis W Contrast  05/07/2016  CLINICAL DATA:  25 year old female status post recent C-section presenting with right lower quadrant abdominal pain. EXAM: CT ABDOMEN AND PELVIS WITH CONTRAST TECHNIQUE: Multidetector CT imaging of the abdomen and pelvis was performed using the standard protocol following bolus administration of intravenous contrast. CONTRAST:  100mL ISOVUE-300 IOPAMIDOL (ISOVUE-300) INJECTION 61% COMPARISON:  None. FINDINGS: Partially visualized small bilateral pleural effusions. The visualized lung bases are clear. No intra-abdominal free air. Small free fluid noted within the pelvis. The liver, gallbladder, pancreas, spleen, adrenal glands, kidneys,  visualized ureters appear unremarkable. There is diffuse haziness of the bladder wall, likely reactive to inflammatory changes of the pelvis. Cystitis is less likely but not excluded. Correlation with urinalysis recommended. The uterus is anteverted, enlarged and heterogeneous. A C-section incision noted in the lower anterior uterine wall. The visualized ovaries appear unremarkable. There is a 4.2 x 4.8 cm fluid collection with thick enhancing wall in the right hemipelvis, inferior and lateral to the right ovary, compatible with an abscess. Small amount of fluid in the pelvis concerning for infected fluid. There is apparent enhancement of the pelvic peritoneal surface. Clinical correlation is recommended to evaluate for peritonitis. Oral contrast opacifies the stomach and multiple loops of small bowel. There is no evidence of bowel obstruction. There is moderate stool throughout the colon. The visualized appendix fills with air and appear unremarkable. There is diastases of anterior abdominal wall musculature in the midline. There is focal area of periumbilical subcutaneous haziness, likely related to surgical port placement. No fluid collection or hematoma. The osseous structures are intact. The abdominal aorta and IVC appear unremarkable. No portal venous gas identified. There is no adenopathy. IMPRESSION: Loculated fluid collection/ abscess in the right pelvis. Small amount of free fluid within the pelvis concerning for infected fluid. There is enhancement of the pelvic peritoneal surface. Correlation with clinical exam recommended to evaluate for peritonitis. The visualized portions of the appendix appear unremarkable. The appendix is not entirely visualized due to inflammatory changes and abscess of the right hemipelvis. The pelvic abscess however is less likely felt to be secondary to appendicitis. Electronically Signed   By: Elgie CollardArash  Radparvar M.D.   On: 05/07/2016 13:36   Assessment/Plan:  25 y/o POD # 21  with right lower quadrant pain, fevers chills, with post-op pelvic abscess 4.8cm,  -Admit patient. -Antibiotics Unasyn and doxycycline -Tylenol for fever -Dilaudid and ibuprofen for pain -Urine culture, blood culture, GC/chlam results pending.  -Conside ID/Surgery consult if no improvement in symptoms.   Konrad FelixKULWA,Hilding Quintanar WAKURU MD.  05/07/2016, 6:30 PM

## 2016-05-07 NOTE — MAU Note (Signed)
Hd a c/s 3 wks ago, was fine until she started getting these weird pains in her lower abd. Went to her dr, was given an antibiotic for infection, seemed to help with that. Past couple days is having pain that starts in upper abd, goes down into lower abd.

## 2016-05-07 NOTE — MAU Note (Signed)
[facility]. Will be directly admtited to room 304. [facility]

## 2016-05-08 LAB — CBC WITH DIFFERENTIAL/PLATELET
BASOS ABS: 0 10*3/uL (ref 0.0–0.1)
Basophils Relative: 0 %
EOS PCT: 2 %
Eosinophils Absolute: 0.2 10*3/uL (ref 0.0–0.7)
HEMATOCRIT: 27.1 % — AB (ref 36.0–46.0)
Hemoglobin: 8.8 g/dL — ABNORMAL LOW (ref 12.0–15.0)
LYMPHS ABS: 1 10*3/uL (ref 0.7–4.0)
LYMPHS PCT: 11 %
MCH: 28.3 pg (ref 26.0–34.0)
MCHC: 32.5 g/dL (ref 30.0–36.0)
MCV: 87.1 fL (ref 78.0–100.0)
MONO ABS: 0.7 10*3/uL (ref 0.1–1.0)
Monocytes Relative: 7 %
NEUTROS ABS: 7.3 10*3/uL (ref 1.7–7.7)
Neutrophils Relative %: 80 %
Platelets: 295 10*3/uL (ref 150–400)
RBC: 3.11 MIL/uL — AB (ref 3.87–5.11)
RDW: 13.6 % (ref 11.5–15.5)
WBC: 9.2 10*3/uL (ref 4.0–10.5)

## 2016-05-08 LAB — URINE CULTURE: SPECIAL REQUESTS: NORMAL

## 2016-05-08 LAB — GC/CHLAMYDIA PROBE AMP (~~LOC~~) NOT AT ARMC
Chlamydia: NEGATIVE
NEISSERIA GONORRHEA: NEGATIVE

## 2016-05-08 MED ORDER — DOXYCYCLINE HYCLATE 100 MG PO TABS
100.0000 mg | ORAL_TABLET | Freq: Two times a day (BID) | ORAL | Status: DC
  Administered 2016-05-08 (×2): 100 mg via ORAL
  Filled 2016-05-08 (×3): qty 1

## 2016-05-08 MED ORDER — AMOXICILLIN-POT CLAVULANATE 875-125 MG PO TABS
1.0000 | ORAL_TABLET | Freq: Two times a day (BID) | ORAL | Status: DC
  Administered 2016-05-08 (×2): 1 via ORAL
  Filled 2016-05-08 (×3): qty 1

## 2016-05-08 NOTE — Progress Notes (Addendum)
Subjective: Patient reports feeling better but still a little pelvic pain.  No N/V.  +BM and voiding without difficulty.      Objective: I have reviewed patient's vital signs and intake and output.  General: alert and no distress Resp: clear to auscultation bilaterally Cardio: regular rate and rhythm GI: soft, incision well healed, NABS, + tenderness to deep palpation Extremities: Homans sign is negative, no sign of DVT   Assessment/Plan: Improving nicely.  Last temp at 8p last night If still without temp in am, may d/c home on total of 14day of abx Cont to observe  Cultures pending  LOS: 1 day    Latasha Buczkowski Y 05/08/2016, 1:29 PM

## 2016-05-09 ENCOUNTER — Encounter (HOSPITAL_COMMUNITY): Payer: Self-pay | Admitting: Internal Medicine

## 2016-05-09 DIAGNOSIS — B9689 Other specified bacterial agents as the cause of diseases classified elsewhere: Secondary | ICD-10-CM

## 2016-05-09 DIAGNOSIS — D649 Anemia, unspecified: Secondary | ICD-10-CM | POA: Diagnosis present

## 2016-05-09 DIAGNOSIS — O860 Infection of obstetric surgical wound: Secondary | ICD-10-CM

## 2016-05-09 MED ORDER — COMPLETENATE 29-1 MG PO CHEW
1.0000 | CHEWABLE_TABLET | Freq: Every day | ORAL | Status: DC
Start: 2016-05-09 — End: 2016-05-12
  Administered 2016-05-09 – 2016-05-12 (×4): 1 via ORAL
  Filled 2016-05-09 (×5): qty 1

## 2016-05-09 MED ORDER — SODIUM CHLORIDE 0.9 % IV SOLN
3.0000 g | Freq: Four times a day (QID) | INTRAVENOUS | Status: DC
  Administered 2016-05-09 – 2016-05-11 (×9): 3 g via INTRAVENOUS
  Filled 2016-05-09 (×10): qty 3

## 2016-05-09 MED ORDER — DEXTROSE 5 % IV SOLN
100.0000 mg | Freq: Two times a day (BID) | INTRAVENOUS | Status: DC
  Administered 2016-05-09 – 2016-05-11 (×5): 100 mg via INTRAVENOUS
  Filled 2016-05-09 (×6): qty 100

## 2016-05-09 MED ORDER — IBUPROFEN 100 MG/5ML PO SUSP
800.0000 mg | Freq: Three times a day (TID) | ORAL | Status: DC | PRN
  Administered 2016-05-09 – 2016-05-12 (×3): 800 mg via ORAL
  Filled 2016-05-09 (×4): qty 40

## 2016-05-09 NOTE — Consult Note (Signed)
[facility]    Date of Admission:  05/07/2016   Total days of antibiotics 9        Day 2 ampicillin sulbactam        Day 2 doxycycline              Reason for Consult: Postoperative pelvic abscess    Referring Physician: Dr. Jaymes GraffNaima Dillard  Principal Problem:   Postpartum pelvic abscess Active Problems:   Normocytic anemia   . ampicillin-sulbactam (UNASYN) IV  3 g Intravenous Q6H  . diatrizoate meglumine-sodium  30 mL Oral Once  . doxycycline (VIBRAMYCIN) IV  100 mg Intravenous Q12H  . prenatal vitamin w/FE, FA  1 tablet Oral Q1200    Recommendations: 1. Continue current antibiotics overnight and then reevaluate for possible abscess drainage in the morning   Assessment: [name] has developed a postoperative pelvic abscess. She continues to be febrile and have pain after 48 hours of broadened antibiotic therapy. If she does not show clear improvement overnight I would ask interventional radiology to aspirate the abscess tomorrow morning and send specimens for Gram stain, aerobic and anaerobic culture. I would then add broader gram-negative rod coverage by changing antibiotic therapy to piperacillin tazobactam. I will follow up in the morning.    HPI: Heidi Ford is a 25 y.o. female who underwent C-section on 04/16/2016 at 28-1/2 weeks delivering a healthy baby girl. About 1 week after surgery she began to develop right lower quadrant pain. She felt hot and had some chills and sweats but did not take her temperature at home. She did not note any redness, swelling or drainage from her incision. She was seen in the office On 04/30/2016 and started on empiric cephalexin. She noted gradual worsening of her right lower quadrant pain and continued to feel hot and had chills and sweats leading to admission on 05/07/2016. A CT scan revealed a 4.2 x 4.8 cm right pelvic abscess distinct from her right ovary. Admission blood cultures have been negative.  She was started on empiric ampicillin sulbactam and doxycycline. Her pain is under better control with pain medication but she has not noted any other improvement. She continues to have high fevers up to 102.8 with associated chills and sweats.   Review of Systems: Review of Systems  Constitutional: Positive for fever, chills, malaise/fatigue and diaphoresis. Negative for weight loss.  HENT: Positive for sore throat.   Respiratory: Negative for cough, sputum production and shortness of breath.   Cardiovascular: Negative for chest pain.  Gastrointestinal: Positive for nausea. Negative for heartburn, vomiting, abdominal pain, diarrhea and constipation.  Genitourinary: Negative for dysuria.  Musculoskeletal: Negative for myalgias, back pain and joint pain.       She notes a little bit of swelling on her right lateral ankle today.  Skin: Negative for rash.  Neurological: Negative for headaches.  Psychiatric/Behavioral: Negative for depression. The patient is nervous/anxious.     Past Medical History  Diagnosis Date  . Anxiety   . Childhood asthma     Resolved  . Hyperthyroidism     "over active thyroid"  . Depression     Social History  Substance Use Topics  . Smoking status: Never Smoker   . Smokeless tobacco: Never Used  . Alcohol Use: No    Family History  Problem Relation Age of Onset  . Healthy Mother     Living  . Healthy Father  Living  . Diabetes Maternal Grandmother   . Diabetes Maternal Aunt   . Healthy Sister     x1  . Asthma Sister     #2-Resolved   No Known Allergies  OBJECTIVE: Blood pressure 127/65, pulse 96, temperature 102.8 F (39.3 C), temperature source Oral, resp. rate 16, height  (1.702 m), weight 162 lb (73.483 kg), SpO2 99 %, not currently breastfeeding.  Physical Exam  Constitutional: She is oriented to person, place, and time.  She is smiling and in good spirits.  HENT:  Mouth/Throat: No oropharyngeal exudate.  Eyes:  Conjunctivae are normal.  Cardiovascular: Normal rate and regular rhythm.   No murmur heard. Pulmonary/Chest: Effort normal and breath sounds normal. She has no wheezes. She has no rales.  Abdominal: Soft. Bowel sounds are normal. She exhibits no mass. There is tenderness.  Right lower quadrant tenderness with palpation. Her low transverse incision looks good without obvious infection.  Musculoskeletal: Normal range of motion. She exhibits no edema or tenderness.  Neurological: She is alert and oriented to person, place, and time.  Skin: No rash noted.  Psychiatric: Mood and affect normal.    Lab Results Lab Results  Component Value Date   WBC 9.2 05/08/2016   HGB 8.8* 05/08/2016   HCT 27.1* 05/08/2016   MCV 87.1 05/08/2016   PLT 295 05/08/2016    Lab Results  Component Value Date   CREATININE 0.57 05/07/2016   BUN 8 05/07/2016   NA 137 05/07/2016   K 3.3* 05/07/2016   CL 105 05/07/2016   CO2 24 05/07/2016    Lab Results  Component Value Date   ALT 9* 05/07/2016   AST 11* 05/07/2016   ALKPHOS 94 05/07/2016   BILITOT 1.0 05/07/2016     Microbiology: Recent Results (from the past 240 hour(s))  Culture, blood (routine x 2)     Status: None (Preliminary result)   Collection Time: 05/07/16  4:05 PM  Result Value Ref Range Status   Specimen Description BLOOD RIGHT ARM  Final   Special Requests BOTTLES DRAWN AEROBIC AND ANAEROBIC 5CC  Final   Culture   Final    NO GROWTH 2 DAYS Performed at [facility]    Report Status PENDING  Incomplete  Culture, blood (routine x 2)     Status: None (Preliminary result)   Collection Time: 05/07/16  4:10 PM  Result Value Ref Range Status   Specimen Description BLOOD LEFT ANTECUBITAL  Final   Special Requests BOTTLES DRAWN AEROBIC AND ANAEROBIC 7 CC  Final   Culture   Final    NO GROWTH 2 DAYS Performed at [facility]    Report Status PENDING  Incomplete  Culture, Urine     Status: Abnormal   Collection Time:  05/07/16  5:20 PM  Result Value Ref Range Status   Specimen Description URINE, CLEAN CATCH  Final   Special Requests Normal  Final   Culture (A)  Final    1,000 COLONIES/mL INSIGNIFICANT GROWTH Performed at [facility]    Report Status 05/08/2016 FINAL  Final     Cliffton Asters, MD [facility] [phone] pager   [phone] cell 05/09/2016, 4:43 PM

## 2016-05-09 NOTE — Progress Notes (Signed)
*   No surgery found *  Subjective: Patient reports tolerating PO, + flatus and + BM.    Objective: I have reviewed patient's vital signs, intake and output, medications, labs and microbiology.  General: alert and cooperative Resp: clear to auscultation bilaterally Cardio: regular rate and rhythm GI: soft, non-tender; bowel sounds normal; no masses,  no organomegaly and incision: clean, dry and intact Extremities: extremities normal, atraumatic, no cyanosis or edema Vaginal Bleeding: minimal  Assessment: s/p * No surgery found *: post-op fever and day 24 after CS with mild pelvic pain and 4.8 cm abscess  Plan: Continue ABX therapy due to Post-op infection Will consult ID for any further ideas regarding abx coverage  If no relief after 48 -72 hours will consider IR for drainage or possible LS  LOS: 2 days    Charlisa Cham A 05/09/2016, 3:07 PM

## 2016-05-09 NOTE — Progress Notes (Signed)
Noted temp of 101.2 at 0604, 100.2 at 0546.   Reviewed temps through the night--100.7 at 2118, 102 at 1959.  Consulted with Dr. Su Hiltoberts. IV restarted (had infiltrated late yesterday afternoon, OK'd by Dr. Su Hiltoberts to leave out and change to po Augmentin and Doxycycline). Restart Unasyn 3 gm IV q 6 hours and Doxycycline 100 mg po BID.  RN noted temps at 1959 and 2118, gave Tylenol at 2025, but did not notify provider.  Blood cultures no growth < 24 hours Urine culture negative/insignificant growth.  Will continue to observe.  Nigel BridgemanVicki Journiee Feldkamp, CNM 05/09/16 [phone]

## 2016-05-10 NOTE — Progress Notes (Addendum)
Heidi Ford, Heidi Ford.  DOB: July 24, 1991 MRN:  [id number]  Subjective: Patient reports feeling better. Her pain is better controlled, gets to 8/10 at worst point, now is a 3/10.  She denies any recent fevers or chills.  She is tolerating regular diet, no nausea/vomiting. She is ambulating and voiding without difficulty.    Objective: I have reviewed patient's vital signs, medications, labs and microbiology. Filed Vitals:   05/10/16 0625 05/10/16 0915 05/10/16 1036 05/10/16 1351  BP: 117/52 129/65  119/63  Pulse: 88 89  87  Temp: 99.9 F (37.7 C) 100.7 F (38.2 C) 98.8 F (37.1 C) 98.8 F (37.1 C)  TempSrc: Oral Oral  Oral  Resp: 16 16  16   Height:      Weight:      SpO2: 100% 100%  100%   Last fever 100.7 @0915am  on 05/10/16.   Tmax:101 @ 1545 on 6/14.  General: alert, cooperative, no distress and smiling appropriately.  Resp: clear to auscultation bilaterally Cardio: regular rate and rhythm, S1, S2 normal, no murmur, click, rub or gallop GI: soft, non-tender; bowel sounds normal; no masses,  no organomegaly Extremities: extremities normal, atraumatic, no cyanosis or edema Incision: Clean/dry and intact  Abdomen is tender to palpation in right lower quadrant, no rebound,no guarding.   CBC    Component Value Date/Time   WBC 9.2 05/08/2016 0512   RBC 3.11* 05/08/2016 0512   HGB 8.8* 05/08/2016 0512   HCT 27.1* 05/08/2016 0512   PLT 295 05/08/2016 0512   MCV 87.1 05/08/2016 0512   MCH 28.3 05/08/2016 0512   MCHC 32.5 05/08/2016 0512   RDW 13.6 05/08/2016 0512   LYMPHSABS 1.0 05/08/2016 0512   MONOABS 0.7 05/08/2016 0512   EOSABS 0.2 05/08/2016 0512   BASOSABS 0.0 05/08/2016 0512   05/07/16: Urine culture final:  Negative.  05/07/16: Blood cultures X2 : Preliminary: negative.   Current facility-administered medications:  .  acetaminophen (TYLENOL) solution 1,000 mg, 1,000 mg, Oral, Q6H PRN, Hoover BrownsEma Delani Kohli, MD, 1,000 mg at 05/10/16 0934 .  Ampicillin-Sulbactam (UNASYN) 3 g in  sodium chloride 0.9 % 100 mL IVPB, 3 g, Intravenous, Q6H, Nigel BridgemanVicki Latham, CNM, 3 g at 05/10/16 1133 .  diatrizoate meglumine-sodium (GASTROGRAFIN) 66-10 % solution 30 mL, 30 mL, Oral, Once, Jennifer I Rasch, NP .  doxycycline (VIBRAMYCIN) 100 mg in dextrose 5 % 250 mL IVPB, 100 mg, Intravenous, Q12H, Nigel BridgemanVicki Latham, CNM, 100 mg at 05/10/16 0735 .  HYDROmorphone (DILAUDID) tablet 2 mg, 2 mg, Oral, Q4H PRN, Hoover BrownsEma Ilsa Bonello, MD, 2 mg at 05/10/16 1041 .  ibuprofen (ADVIL,MOTRIN) 100 MG/5ML suspension 800 mg, 800 mg, Oral, Q8H PRN, Naima Dillard, MD, 800 mg at 05/09/16 1737 .  lactated ringers infusion, , Intravenous, Continuous, Duane LopeJennifer I Rasch, NP, Last Rate: 125 mL/hr at 05/10/16 0544 .  prenatal vitamin w/FE, FA (NATACHEW) chewable tablet 1 tablet, 1 tablet, Oral, Q1200, Jaymes GraffNaima Dillard, MD, 1 tablet at 05/10/16 1133  Assessment: Hospital Day # 4, admitted with abdominal pain, POD # 24 after a primary C/S for fetal intolerance of labor,  found to have a 4.8 cm pelvic abscess  Plan: -s/p ID consult, continue with current drug regimen, if fevers continue rest of today then obtain Intervention Radiology for abscess drainage. If remains afebrile rest of today would switch to oral antibiotics.  -f/u final blood cultures.  -Out of bed and ambulation encouraged.     LOS: 3 days    [facility],Dyer Klug [facility] 05/10/2016, 1:08 PM  Telephone call from RN, patient just  had a fever of 101.9 degrees.  I consulted with Interventional radiology for pelvic abscess drainage, he advised make patient NPO after midnight, order CT guided abscess drain.  Patient will be added on to cases tomorrow either at [facility] or [facility].    Dr. Sallye Ober. 05/10/2016, 1726.

## 2016-05-10 NOTE — Progress Notes (Signed)
Patient ID: Heidi Ford, female   DOB: 1991/02/13, 25 y.o.   MRN: [id number]         [facility]    Date of Admission:  05/07/2016   Total days of antibiotics 10        Day 3 ampicillin sulbactam        Day 3 doxycycline         Principal Problem:   Postpartum pelvic abscess Active Problems:   Normocytic anemia   . ampicillin-sulbactam (UNASYN) IV  3 g Intravenous Q6H  . diatrizoate meglumine-sodium  30 mL Oral Once  . doxycycline (VIBRAMYCIN) IV  100 mg Intravenous Q12H  . prenatal vitamin w/FE, FA  1 tablet Oral Q1200    SUBJECTIVE: She is feeling a little bit better today. Her pain is under better control with pain medications. She did not feel like she had any fever overnight and had only one very mild chill and sweat around 11 PM.  Review of Systems: Review of Systems  Constitutional: Positive for chills and diaphoresis. Negative for fever.  Gastrointestinal: Positive for abdominal pain. Negative for nausea, vomiting, diarrhea and constipation.    Past Medical History  Diagnosis Date  . Anxiety   . Childhood asthma     Resolved  . Hyperthyroidism     "over active thyroid"  . Depression     Social History  Substance Use Topics  . Smoking status: Never Smoker   . Smokeless tobacco: Never Used  . Alcohol Use: No    Family History  Problem Relation Age of Onset  . Healthy Mother     Living  . Healthy Father     Living  . Diabetes Maternal Grandmother   . Diabetes Maternal Aunt   . Healthy Sister     x1  . Asthma Sister     #2-Resolved   No Known Allergies  OBJECTIVE: Filed Vitals:   05/09/16 1701 05/09/16 2223 05/10/16 0138 05/10/16 0625  BP: 94/62 114/65 111/53 117/52  Pulse: 99 70 86 88  Temp: 100.3 F (37.9 C) 98.3 F (36.8 C) 98.6 F (37 C) 99.9 F (37.7 C)  TempSrc: Oral Oral Oral Oral  Resp: Height:      Weight:      SpO2: 98%  99% 100%   Body mass index is 25.37 kg/(m^2).  Physical  Exam  Constitutional:  She is alert and smiling, sitting up in bed.  Cardiovascular: Normal rate and regular rhythm.   No murmur heard. Pulmonary/Chest: Breath sounds normal. She has no wheezes. She has no rales.  Abdominal: Soft. Bowel sounds are normal. There is tenderness.  Mild, diffuse right lower quadrant tenderness with palpation.    Lab Results Lab Results  Component Value Date   WBC 9.2 05/08/2016   HGB 8.8* 05/08/2016   HCT 27.1* 05/08/2016   MCV 87.1 05/08/2016   PLT 295 05/08/2016    Lab Results  Component Value Date   CREATININE 0.57 05/07/2016   BUN 8 05/07/2016   NA 137 05/07/2016   K 3.3* 05/07/2016   CL 105 05/07/2016   CO2 24 05/07/2016    Lab Results  Component Value Date   ALT 9* 05/07/2016   AST 11* 05/07/2016   ALKPHOS 94 05/07/2016   BILITOT 1.0 05/07/2016     Microbiology: Recent Results (from the past 240 hour(s))  Culture, blood (routine x 2)     Status: None (Preliminary result)  Collection Time: 05/07/16  4:05 PM  Result Value Ref Range Status   Specimen Description BLOOD RIGHT ARM  Final   Special Requests BOTTLES DRAWN AEROBIC AND ANAEROBIC 5CC  Final   Culture   Final    NO GROWTH 2 DAYS [facility]    Report Status PENDING  Incomplete  Culture, blood (routine x 2)     Status: None (Preliminary result)   Collection Time: 05/07/16  4:10 PM  Result Value Ref Range Status   Specimen Description BLOOD LEFT ANTECUBITAL  Final   Special Requests BOTTLES DRAWN AEROBIC AND ANAEROBIC 7 CC  Final   Culture   Final    NO GROWTH 2 DAYS [facility]formed [facility]    Report Status PENDING  Incomplete  Culture, Urine     Status: Abnormal   Collection Time: 05/07/16  5:20 PM  Result Value Ref Range Status   Specimen Description URINE, CLEAN CATCH  Final   Special Requests Normal  Final   Culture (A)  Final    1,000 COLONIES/mL INSIGNIFICANT GROWTH Performed at [facility]    Report Status 05/08/2016  FINAL  Final     ASSESSMENT: It appears that she may be starting to improve on empiric IV antibiotic therapy. I discussed the options with her again. I am comfortable observing on current antibiotic therapy through the day today. If her fever breaks then we can consider converting back to oral antibiotic therapy and finishing treatment as an outpatient. If she continues to spike fevers later today than I would ask interventional radiology to aspirate an attempt to drain the right lower quadrant abscess.  PLAN: 1. Recommend continuing current antibiotic therapy for now  Cliffton AstersJohn Monisha Siebel, MD [facility] [phone] pager   [phone] cell 05/10/2016, 8:07 AM

## 2016-05-10 NOTE — Progress Notes (Addendum)
In to make the acquaintance of Archer AsaSimone D. Lamb, 25 yo G1 now P1 who was admitted on 05/07/16 for post op infection/4.8 cm abscess. Had primary LTCS on 04/16/16 due to fetal intolerance to labor.  Subjective: Misses her baby girl Nyla. Hoping that abscess can be drained as she does not want to endure the pain any longer. Tolerating PO. + Flatus and BM. Pain well controlled w/ Dilaudid - last dose today at 0540.  Objective: BPs 111-117/52-65  T max 99.9  P 70-88 bpm  R 14-16  Sp02 99-100% RA  General: sleeping, yet easily aroused. Pleasant demeanor. Lungs: CTAB. CV: RRR w/o M/R/G. Abdomen: soft, tender to RLQ, non-tender to L, active bowel sounds x4, no masses or organomegaly. Incision C/D/I. Extremities: Atraumatic without cyanosis or edema. Vaginal Bleeding: minimal.  Assessment: Post-op fever and day 24 after CS with pelvic pain and 4.8 cm abscess. Afebrile since 1545 on 05/09/16.  Plan: Continue antibiotic therapy due to post-op infection. ID to see patient today.  LOS: 3 days    Sherre ScarletKimberly Raynell Upton, CNM 05/10/16, 06:50 AM

## 2016-05-11 ENCOUNTER — Encounter (HOSPITAL_COMMUNITY): Payer: Self-pay

## 2016-05-11 ENCOUNTER — Ambulatory Visit (HOSPITAL_COMMUNITY)
Admit: 2016-05-11 | Discharge: 2016-05-11 | Disposition: A | Discharge status: Home / Self Care | Payer: Medicaid Other | Attending: Obstetrics & Gynecology | Admitting: Obstetrics & Gynecology

## 2016-05-11 ENCOUNTER — Ambulatory Visit (HOSPITAL_COMMUNITY)
Admit: 2016-05-11 | Discharge: 2016-05-11 | Disposition: A | Discharge status: Home / Self Care | Payer: Medicaid Other | Source: Ambulatory Visit | Attending: Obstetrics and Gynecology | Admitting: Obstetrics and Gynecology

## 2016-05-11 DIAGNOSIS — O8689 Other specified puerperal infections: Secondary | ICD-10-CM | POA: Insufficient documentation

## 2016-05-11 LAB — BLOOD CULTURE ID PANEL (REFLEXED)
Acinetobacter baumannii: NOT DETECTED
CANDIDA ALBICANS: NOT DETECTED
CANDIDA GLABRATA: NOT DETECTED
CANDIDA TROPICALIS: NOT DETECTED
Candida krusei: NOT DETECTED
Candida parapsilosis: NOT DETECTED
Carbapenem resistance: NOT DETECTED
ENTEROBACTER CLOACAE COMPLEX: NOT DETECTED
ESCHERICHIA COLI: NOT DETECTED
Enterobacteriaceae species: NOT DETECTED
Enterococcus species: NOT DETECTED
HAEMOPHILUS INFLUENZAE: NOT DETECTED
Klebsiella oxytoca: NOT DETECTED
Klebsiella pneumoniae: NOT DETECTED
Listeria monocytogenes: NOT DETECTED
METHICILLIN RESISTANCE: NOT DETECTED
NEISSERIA MENINGITIDIS: NOT DETECTED
PROTEUS SPECIES: NOT DETECTED
Pseudomonas aeruginosa: NOT DETECTED
STREPTOCOCCUS AGALACTIAE: NOT DETECTED
STREPTOCOCCUS PNEUMONIAE: NOT DETECTED
STREPTOCOCCUS PYOGENES: NOT DETECTED
STREPTOCOCCUS SPECIES: NOT DETECTED
Serratia marcescens: NOT DETECTED
Staphylococcus aureus (BCID): NOT DETECTED
Staphylococcus species: NOT DETECTED
Vancomycin resistance: NOT DETECTED

## 2016-05-11 LAB — CBC
HCT: 24.1 % — ABNORMAL LOW (ref 36.0–46.0)
Hemoglobin: 7.8 g/dL — ABNORMAL LOW (ref 12.0–15.0)
MCH: 28 pg (ref 26.0–34.0)
MCHC: 32.4 g/dL (ref 30.0–36.0)
MCV: 86.4 fL (ref 78.0–100.0)
PLATELETS: 367 10*3/uL (ref 150–400)
RBC: 2.79 MIL/uL — ABNORMAL LOW (ref 3.87–5.11)
RDW: 13.7 % (ref 11.5–15.5)
WBC: 10.3 10*3/uL (ref 4.0–10.5)

## 2016-05-11 LAB — PROTIME-INR
INR: 1.14 (ref 0.00–1.49)
Prothrombin Time: 14.7 seconds (ref 11.6–15.2)

## 2016-05-11 MED ORDER — MIDAZOLAM HCL 2 MG/2ML IJ SOLN
INTRAMUSCULAR | Status: AC
  Filled 2016-05-11: qty 6

## 2016-05-11 MED ORDER — MIDAZOLAM HCL 2 MG/2ML IJ SOLN
INTRAMUSCULAR | Status: AC | PRN
  Administered 2016-05-11 (×6): 1 mg via INTRAVENOUS

## 2016-05-11 MED ORDER — PIPERACILLIN-TAZOBACTAM 3.375 G IVPB
3.3750 g | Freq: Three times a day (TID) | INTRAVENOUS | Status: DC
  Administered 2016-05-12 (×2): 3.375 g via INTRAVENOUS
  Filled 2016-05-11 (×4): qty 50

## 2016-05-11 MED ORDER — DEXTROSE 5 % IV SOLN
2.0000 g | Freq: Two times a day (BID) | INTRAVENOUS | Status: DC
  Administered 2016-05-11: 2 g via INTRAVENOUS
  Filled 2016-05-11 (×2): qty 2

## 2016-05-11 MED ORDER — FENTANYL CITRATE (PF) 100 MCG/2ML IJ SOLN
INTRAMUSCULAR | Status: AC
  Filled 2016-05-11: qty 4

## 2016-05-11 MED ORDER — FENTANYL CITRATE (PF) 100 MCG/2ML IJ SOLN
INTRAMUSCULAR | Status: AC | PRN
  Administered 2016-05-11: 25 ug via INTRAVENOUS
  Administered 2016-05-11: 50 ug via INTRAVENOUS
  Administered 2016-05-11 (×3): 25 ug via INTRAVENOUS

## 2016-05-11 NOTE — Progress Notes (Addendum)
Patient ID: Heidi Ford, female   DOB: Feb 27, 1991, 27 y.o.   MRN: [id number]   IR aware of pelvic abscess request Has been reviewed and approved with Dr Loreta AveWagner  We will call RN with place and time asap   Addendum: [facility] RN aware

## 2016-05-11 NOTE — Progress Notes (Signed)
Patient ID: Heidi Ford, female   DOB: July 21, 1991, 25 y.o.   MRN: [id number]         [facility]    Date of Admission:  05/07/2016   Total days of antibiotics 11        Day 4 ampicillin-sulbactam and doxycycline  [name] temperature curve seems to be trending down but she is still having fever. [facility] of her right lowe abscess.One blood culture is growing an anaerobic gram-negative rod. I will broaden her antibiotic therapy to piperacillin tazobactam pending final culture results.         Cliffton AstersJohn Zoltan Genest, MD [facility] [phone] pager   [phone] cell 11/29/2015, 1:32 PM

## 2016-05-11 NOTE — Lactation Note (Signed)
Lactation Consultation Note  Patient Name: Linward NatalSimone D Digiacomo WUJWJ'XToday's Date: 05/11/2016    Follow up with mom. Mom reports she is no longer BF and is not in need of LC Services at this time.    Maternal Data    Feeding    LATCH Score/Interventions                      Lactation Tools Discussed/Used     Consult Status      Ed BlalockSharon S Aydia Maj 05/11/2016, 11:04 AM

## 2016-05-11 NOTE — Consult Note (Signed)
Chief Complaint: intra-abdominal abscess  Referring Physician:Dr. Hoover Browns  Supervising Physician: Jolaine Click  Patient Status: In-pt   HPI: Heidi Ford is an 25 y.o. female who underwent a c-section on 04-16-16.  She went home and about a week later complained of pain and "walking funny".  She saw her PCP who put her on abx therapy for a wound infection from her c-section.  Her pain continued to worsen and she went to [facility] earlier this week.  She had a CT scan that revealed a 4.2x4.8cm abscess in her pelvis.  She was admitted and placed on abx therapy.  She has continued to have pain and spiking fevers.  We have been asked today for evaluation for a percutaneous drain placement.  Past Medical History:  Past Medical History  Diagnosis Date  . Anxiety   . Childhood asthma     Resolved  . Hyperthyroidism     "over active thyroid"  . Depression     Past Surgical History:  Past Surgical History  Procedure Laterality Date  . Wisdom tooth extraction    . Cesarean section N/A 04/16/2016    Procedure: CESAREAN SECTION;  Surgeon: Jaymes Graff, MD;  Location: [facility];  Service: Obstetrics;  Laterality: N/A;    Family History:  Family History  Problem Relation Age of Onset  . Healthy Mother     Living  . Healthy Father     Living  . Diabetes Maternal Grandmother   . Diabetes Maternal Aunt   . Healthy Sister     x1  . Asthma Sister     #2-Resolved    Social History:  reports that she has never smoked. She has never used smokeless tobacco. She reports that she does not drink alcohol or use illicit drugs.  Allergies: No Known Allergies  Medications:   Medication List    Notice    Cannot display patient medications because the patient has not yet been checked in.      Please HPI for pertinent positives, otherwise complete 10 system ROS negative.  Mallampati Score: MD Evaluation Airway: WNL Heart: WNL Abdomen: WNL Chest/ Lungs: WNL ASA   Classification: 1 Mallampati/Airway Score: One  Physical Exam: Temp (F)     98.8-101.9 98.7  98.7 (37.1)  06/16 1000   Pulse Rate     84-101 74  74  06/16 1000   Resp     16-18 18  18   06/16 1000   BP     119/63-139/70 110/64  --  06/16 1400   SpO2 (%)     99-100 100  100  06/16 1000       General: pleasant, WD, WN black female who is laying in bed in NAD HEENT: head is normocephalic, atraumatic.  Sclera are noninjected.  PERRL.  Ears and nose without any masses or lesions.  Mouth is pink and moist Heart: regular, rate, and rhythm.  Normal s1,s2. No obvious murmurs, gallops, or rubs noted.  Palpable radial and pedal pulses bilaterally Lungs: CTAB, no wheezes, rhonchi, or rales noted.  Respiratory effort nonlabored Abd: soft, mild lower abdominal tenderness, still with some distention post partum , +BS, no masses, hernias.  Pfannenstiel incision is healing well with a small pinpoint opening in the very central portion of the incision with no active drainage currently.  No erythema. MS: all 4 extremities are symmetrical with no cyanosis, clubbing, or edema. Psych: A&Ox3 with an appropriate affect.   Labs: Results for  orders placed or performed during the hospital encounter of 05/07/16 (from the past 48 hour(s))  CBC     Status: Abnormal   Collection Time: 05/11/16  9:52 AM  Result Value Ref Range   WBC 10.3 4.0 - 10.5 K/uL   RBC 2.79 (L) 3.87 - 5.11 MIL/uL   Hemoglobin 7.8 (L) 12.0 - 15.0 g/dL   HCT 81.124.1 (L) 91.436.0 - 78.246.0 %   MCV 86.4 78.0 - 100.0 fL   MCH 28.0 26.0 - 34.0 pg   MCHC 32.4 30.0 - 36.0 g/dL   RDW 95.613.7 21.311.5 - 08.615.5 %   Platelets 367 150 - 400 K/uL  Protime-INR     Status: None   Collection Time: 05/11/16  9:52 AM  Result Value Ref Range   Prothrombin Time 14.7 11.6 - 15.2 seconds   INR 1.14 0.00 - 1.49    Imaging: No results found.  Assessment/Plan 1. Intra-abdominal abscess, s/p c-section -we will plan on placement of percutaneous drain today to  drain this abscess -labs and vitals have been reviewed -Risks and Benefits discussed with the patient including bleeding, infection, damage to adjacent structures, bowel perforation/fistula connection, and sepsis. All of the patient's questions were answered, patient is agreeable to proceed. Consent signed and in chart.   Thank you for this interesting consult.  I greatly enjoyed meeting Linward NatalSimone D Encarnacion and look forward to participating in their care.  A copy of this report was sent to the requesting provider on this date.  Electronically Signed: Letha CapeSBORNE,Lawsyn Heiler E 05/11/2016, 2:26 PM   I spent a total of 30 minutes   in face to face in clinical consultation, greater than 50% of which was counseling/coordinating care for intra-abdominal abscess

## 2016-05-11 NOTE — Progress Notes (Signed)
PHARMACY - PHYSICIAN COMMUNICATION CRITICAL VALUE ALERT - BLOOD CULTURE IDENTIFICATION (BCID)  Results for orders placed or performed during the hospital encounter of 05/07/16  Blood Culture ID Panel (Reflexed) (Collected: 05/07/2016  4:05 PM)  Result Value Ref Range   Enterococcus species NOT DETECTED NOT DETECTED   Vancomycin resistance NOT DETECTED NOT DETECTED   Listeria monocytogenes NOT DETECTED NOT DETECTED   Staphylococcus species NOT DETECTED NOT DETECTED   Staphylococcus aureus NOT DETECTED NOT DETECTED   Methicillin resistance NOT DETECTED NOT DETECTED   Streptococcus species NOT DETECTED NOT DETECTED   Streptococcus agalactiae NOT DETECTED NOT DETECTED   Streptococcus pneumoniae NOT DETECTED NOT DETECTED   Streptococcus pyogenes NOT DETECTED NOT DETECTED   Acinetobacter baumannii NOT DETECTED NOT DETECTED   Enterobacteriaceae species NOT DETECTED NOT DETECTED   Enterobacter cloacae complex NOT DETECTED NOT DETECTED   Escherichia coli NOT DETECTED NOT DETECTED   Klebsiella oxytoca NOT DETECTED NOT DETECTED   Klebsiella pneumoniae NOT DETECTED NOT DETECTED   Proteus species NOT DETECTED NOT DETECTED   Serratia marcescens NOT DETECTED NOT DETECTED   Carbapenem resistance NOT DETECTED NOT DETECTED   Haemophilus influenzae NOT DETECTED NOT DETECTED   Neisseria meningitidis NOT DETECTED NOT DETECTED   Pseudomonas aeruginosa NOT DETECTED NOT DETECTED   Candida albicans NOT DETECTED NOT DETECTED   Candida glabrata NOT DETECTED NOT DETECTED   Candida krusei NOT DETECTED NOT DETECTED   Candida parapsilosis NOT DETECTED NOT DETECTED   Candida tropicalis NOT DETECTED NOT DETECTED    Name of physician (or Provider) Contacted: Dr Osborn CohoAngela Roberts  Changes to prescribed antibiotics required: Changed to cefepime 2gm IV q12hrs, dced Unasyn.   Heidi StacksHuff, Heidi Ford 05/11/2016  10:36 AM

## 2016-05-11 NOTE — Progress Notes (Addendum)
Subjective: Patient reports no N/V/tolerating po/still with some pelvic pain.    Objective: I have reviewed patient's vital signs and intake and output. Tmax 101.9 5pm yesterday.  Temp 100 now.  General: alert Resp: clear to auscultation bilaterally Cardio: regular rate and rhythm GI: soft, tender to deep palpation, NABS, ND Extremities: Homans sign is negative, no sign of DVT Vaginal Bleeding: none   Assessment/Plan: Pelvic Abscess with bacteremia (gram neg rods) and continuing with elevated temps.  Pt scheduled for drainage via IR later today.  Pharmacy called recommending changing unasyn to cefepime.  Pt tearful secondary to missing baby and nervous about procedure.     LOS: 4 days    Nikiah Goin Y 05/11/2016, 9:58 AM

## 2016-05-11 NOTE — Progress Notes (Signed)
Pharmacy Antibiotic Note  Heidi Ford is a 25 y.o. female admitted on 05/07/2016 for post-op abcess, s/p C-section on 04/16/16.  Pharmacy has been consulted for Zosyn dosing for gram negative anaerobic rods in one blood culture.  Plan: Zosyn 3.375grams IV every 8 hours (4 hour infusion).  Height: 5\' 7"  (170.2 cm) Weight: 162 lb (73.483 kg) IBW/kg (Calculated) : 61.6  Temp (24hrs), Avg:100.1 F (37.8 C), Min:98.7 F (37.1 C), Max:101.9 F (38.8 C)   Recent Labs Lab 05/07/16 1100 05/08/16 0512 05/11/16 0952  WBC 12.6* 9.2 10.3  CREATININE 0.57  --   --     Estimated Creatinine Clearance: 103.6 mL/min (by C-G formula based on Cr of 0.57).    No Known Allergies  Antimicrobials this admission: Unasyn 3 grams IV every 6 hours 6/14-6/16 Doxycycline 100 mg IV every 12 hours 6/14-6/16 Cefepime 2 grams IV every 12 hours 6/16 (got one dose)  Microbiology results: Gram negative rods in anaerobic bottle in one blood culture - awaiting culture and sensitivites  Thank you for allowing pharmacy to be a part of this patient's care.  Hurley CiscoMendenhall, Stafford Riv[place] D 05/11/2016 3:13 PM

## 2016-05-11 NOTE — Progress Notes (Signed)
Report given to [facility] nurse, dustin over the phone. [facility] left [facility]. Sheryn BisonGordon, Hang Ammon Warner

## 2016-05-11 NOTE — Procedures (Signed)
RLQ 10 Fr abscess drain Pus No comp/EBL

## 2016-05-12 DIAGNOSIS — O8689 Other specified puerperal infections: Secondary | ICD-10-CM

## 2016-05-12 LAB — CULTURE, BLOOD (ROUTINE X 2): CULTURE: NO GROWTH

## 2016-05-12 MED ORDER — CIPROFLOXACIN HCL 750 MG PO TABS: 750.0000 mg | ORAL_TABLET | Freq: Two times a day (BID) | ORAL | Status: DC

## 2016-05-12 MED ORDER — OXYCODONE-ACETAMINOPHEN 5-325 MG PO TABS: 1.0000 | ORAL_TABLET | Freq: Four times a day (QID) | ORAL | Status: DC | PRN

## 2016-05-12 MED ORDER — CLINDAMYCIN HCL 300 MG PO CAPS: ORAL_CAPSULE | ORAL | Status: DC

## 2016-05-12 NOTE — Discharge Instructions (Signed)
Home Care  HOME CARE   Only take medicines as told by your doctor.  If you were given antibiotic medicine, take it as directed. Finish the medicine even if you start to feel better.  [facility] as instructed [phone]  To avoid spreading the infection:  Wash your hands well.  Do not share personal care items, towels, or whirlpools with others.  Avoid skin contact with others.  Keep your skin and clothes clean around the abscess.  Keep all doctor visits as told. GET HELP RIGHT AWAY IF:   You have more pain, puffiness (swelling), or redness in the wound site.  You have more fluid or blood coming from the wound site.  You have muscle aches, chills, or you feel sick.  You have a fever. MAKE SURE YOU:   Understand these instructions.  Will watch your condition.  Will get help right away if you are not doing well or get worse.   This information is not intended to replace advice given to you by your health care provider. Make sure you discuss any questions you have with your health care provider.   Document Released: 04/30/2008 Document Revised: 05/13/2012 Document Reviewed: 01/26/2012 Elsevier Interactive Patient Education [employer]2016 Elsevier Inc.

## 2016-05-12 NOTE — Progress Notes (Addendum)
Subjective: Resting comfortably in bed, reports some pain on her side from the drainage, but improved with po pain management.  She denies F/C/CP/SOB overnight.   No nausea/vomiting.  Tolerating gen diet.  Ambulating without difficulty.  No acute complaints.  Objective: BP 114/64 mmHg  Pulse 75  Temp(Src) 97.7 F (36.5 C) (Oral)  Resp 17  Ht 5\' 7"  (1.702 m)  Wt 73.483 kg (162 lb)  BMI 25.37 kg/m2  SpO2 100%  Breastfeeding? No Last Temp: 100.5 @ 0506 (6/16)  Gen: NAD CV:  RRR Lungs: CTAB Abd: soft, non-tender, no distension.  JP drain in place with minimal serosanguinous fluid Uterus: non-tender, below umbilicus Incision: C/D/I well healed Ext: no edema, no calf tenderness bilaterally  Assessment/Plan: Pelvic Abscess with bacteremia (gram neg rods)  -s/p IR drainage with drain in place.  Spoke with IR- [facility]: [phone] (or [phone])  -24hr afebrile, currently on IV Cefepime and Doxy, ID consult for recommendations regarding po antibiotics  -pt symptoms improving with decreased pain and discomfort, VSS Continue routine postpartum care  DISPO: If patient remains afebrile, pending ID recommendations, plan for possible discharge home this evening with po antibiotics and close outpatient follow up.  Myna HidalgoZAN, Leslee Haueter, M 05/12/2016, 8:51 AM

## 2016-05-12 NOTE — Progress Notes (Signed)
D/C instructions and prescriptions reviewed with pt. Pt and family educated on JP drain care and s/s. Supplies given guaze, and tape for dressing change. Pt will call for f/u appt with the drain clinic and [facility] in 1 week. Pt ambulatory, stable, d/c home with family to private car.

## 2016-05-12 NOTE — Progress Notes (Addendum)
    [facility]    Date of Admission:  05/07/2016   Total days of antibiotics 6        Day 6 doxy        Day 2 piptazo           ID: Linward NatalSimone D Linck is a 25 y.o. female with pelvic abscess post c-section s/p IR drainage with drain in place Principal Problem:   Postpartum pelvic abscess Active Problems:   Normocytic anemia    Subjective: Afebrile x 24hr, she reports decreased pain at right lower quadrant, no drainage from incision  Medications:  . diatrizoate meglumine-sodium  30 mL Oral Once  . piperacillin-tazobactam (ZOSYN)  IV  3.375 g Intravenous Q8H  . prenatal vitamin w/FE, FA  1 tablet Oral Q1200    Objective: Vital signs in last 24 hours: Temp:  [97.7 F (36.5 C)-98.5 F (36.9 C)] 98.2 F (36.8 C) (06/17 1000) Pulse Rate:  [70-88] 76 (06/17 1000) Resp:  [14-21] 16 (06/17 1000) BP: (112-137)/(64-80) 121/70 mmHg (06/17 1000) SpO2:  [100 %] 100 % (06/17 1000) Physical Exam  Constitutional:  oriented to person, place, and time. appears well-developed and well-nourished. No distress. Sitting on edge of bed HENT: [place]/AT, PERRLA, no scleral icterus Mouth/Throat: Oropharynx is clear and moist. No oropharyngeal exudate.  Cardiovascular: Normal rate, regular rhythm and normal heart sounds. Exam reveals no gallop and no friction rub.  No murmur heard.  Pulmonary/Chest: Effort normal and breath sounds normal. No respiratory distress.  has no wheezes.  Neck = supple, no nuchal rigidity Abdominal: Soft. Bowel sounds are normal.  exhibits no distension. Mild tenderness RLQ jp drain in place with serous sanginous drainage Lymphadenopathy: no cervical adenopathy. No axillary adenopathy Skin: Skin is warm and dry. No rash noted. No erythema.  Psychiatric: a normal mood and affect.  behavior is normal.    Lab Results  Recent Labs  05/11/16 0952  WBC 10.3  HGB 7.8*  HCT 24.1*    Microbiology: Blood cx - slow growth on gram negative rod in  aerobic culture -ID still pending Aspirate cx - pending Studies/Results: No results found.   Assessment/Plan: Post partum abscess- Will recommend an oral regimen of cipro 750mg  BID plus clindamycin 900mg  PO Q 8hr x 3 weeks - she may have nausea with clindamycin, recommend to give anti-emetic, take on full stomach  - in regards to breast feeding. She will be able to do breast feeding with this regimen. Roughly 3% of ciprofloxacin dose transmitted through BM. Can consider BF before she takes the cipro dose or pump/dump the BM around the time she is taking medication  - will see her back in the ID clinic in 3 wk, continue to follow up with drain clinic  - gram negative bacteremia = likely related to post partum abscess, will follow culture results. Should be covered by fluoroquinolone  - if she has fevers, have her come back to the hospital then will need IV treatment for her pelvic abscess. This oral regimen should have good oral bioavailability and tissue penetration.  Will arrange for follow up in 3 wk in ID clinic   [facility], [facility] Cell: [phone] Pager: [phone]  05/12/2016, 11:29 AM

## 2016-05-13 LAB — CULTURE, BLOOD (ROUTINE X 2)

## 2016-05-13 NOTE — Discharge Summary (Signed)
Physician Discharge Summary  Patient ID: Heidi NatalSimone D Dellarocco MRN: [id number] DOB/AGE: July 25, 1991 25 y.o.  Admit date: 05/07/2016 Discharge date: 05/12/2016  Admission Diagnoses: Pelvic abscess  Discharge Diagnoses:  Principal Problem:   Postpartum pelvic abscess Active Problems:   Normocytic anemia   Discharged Condition: stable  Hospital Course: Heidi Ford is an 25 y.o. female POD # 21 after a primary cesarean section who presented due to significant RLQ pain.  Work up performed which revealed a 4.8cm pelvic abscess.  The patient was treated with IV antibiotics and fluids.  Blood culture revealed gram negative rods- Bacteroides Intermedius.  Due to persistent fevers despite adequate antibiotic coverage, IR drainage of the abscess was performed on 05/11/16.  For information regarding the procedure, please see consult note.  Following the procedure, she symptomatically improved and patient became afebrile.  She was discharged home in stable condition with plan for oral antibiotics (per ID recommendations) and follow up in drain clinic.  Consults: ID and Interventional radiology  Significant Diagnostic Studies: microbiology: blood culture: positive for gram negative rods Diagnostic Imaging: 4.2 x 4.8 cm fluid collection with thick enhancing wall in the right hemipelvis, inferior and lateral to the right ovary, compatible with an abscess. Small amount of fluid in the pelvis concerning for infected fluid.   Treatments: IV hydration, antibiotics: Unasyn and Doxycycline then Cefepime and Doxycycline, analgesia: Dilaudid and procedures: IR drainage  Discharge Exam: Blood pressure 119/60, pulse 72, temperature 98.4 F (36.9 C), temperature source Oral, resp. rate 18, height 5\' 7"  (1.702 m), weight 73.483 kg (162 lb), SpO2 100 %, not currently breastfeeding. Gen: NAD  CV: RRR Lungs: CTAB  Abd: soft, non-tender, no distension. JP drain in place with minimal serosanguinous fluid  Uterus:  non-tender, below umbilicus  Incision: C/D/I well healed  Ext: no edema, no calf tenderness bilaterally  Disposition: 01-Home or Self Care     Medication List    TAKE these medications        ciprofloxacin 750 MG tablet  Commonly known as:  CIPRO  Take 1 tablet (750 mg total) by mouth 2 (two) times daily.     clindamycin 300 MG capsule  Commonly known as:  CLEOCIN  Take 3 tablets every 8 hours     ferrous sulfate 325 (65 FE) MG tablet  Take 1 tablet (325 mg total) by mouth 2 (two) times daily with a meal.     ibuprofen 100 MG/5ML suspension  Commonly known as:  ADVIL,MOTRIN  Take 30 mLs (600 mg total) by mouth every 6 (six) hours.     oxyCODONE-acetaminophen 5-325 MG tablet  Commonly known as:  PERCOCET/ROXICET  Take 1 tablet by mouth every 6 (six) hours as needed (for pain scale 4-7).     prenatal multivitamin Tabs tablet  Take 1 tablet by mouth daily at 12 noon.           Follow-up Information    Follow up with [facility] In 1 week.   Why:  To schedule appointment follow up   Contact information:   [phone] (or [phone]      [facility] In 1 week.   Specialty:  Obstetrics and Gynecology   Contact information:   [address] [place] [address] [phone]       Signed: Sharon SellerOZAN, Graclyn Lawther, M 05/13/2016, 1:56 PM

## 2016-05-15 ENCOUNTER — Other Ambulatory Visit: Payer: Self-pay | Admitting: Obstetrics and Gynecology

## 2016-05-15 DIAGNOSIS — K651 Peritoneal abscess: Secondary | ICD-10-CM

## 2016-05-17 LAB — AEROBIC/ANAEROBIC CULTURE W GRAM STAIN (SURGICAL/DEEP WOUND)

## 2016-05-17 LAB — AEROBIC/ANAEROBIC CULTURE (SURGICAL/DEEP WOUND): CULTURE: NO GROWTH

## 2016-05-22 ENCOUNTER — Ambulatory Visit
Admission: RE | Admit: 2016-05-22 | Discharge: 2016-05-22 | Disposition: A | Discharge status: Home / Self Care | Payer: Medicaid Other | Source: Ambulatory Visit | Attending: Obstetrics and Gynecology | Admitting: Obstetrics and Gynecology

## 2016-05-22 DIAGNOSIS — K651 Peritoneal abscess: Secondary | ICD-10-CM

## 2016-05-22 IMAGING — CT CT ABD-PELV W/ CM
3 of 4 series · 11 of 36 positions shown, 18 images · IV contrast ([ID] ISOVUE 300)
Comparison: [DATE] and previous

CLINICAL DATA: Four weeks postpartum. Abscess post C-section.
Percutaneous drain catheter placed [DATE].

EXAM:
CT ABDOMEN AND PELVIS WITH CONTRAST
TECHNIQUE: Multidetector CT imaging of the abdomen and pelvis was performed
using the standard protocol following bolus administration of
intravenous contrast.
CONTRAST:  100mL [OO] IOPAMIDOL ([OO]) INJECTION 61%

[Series 3: abd/pelvis with · axial · 0.66mm/px · z∈[-294,+26]mm · 9 of 82 slices shown, 15 images]
[im 9/82  soft-tissue]
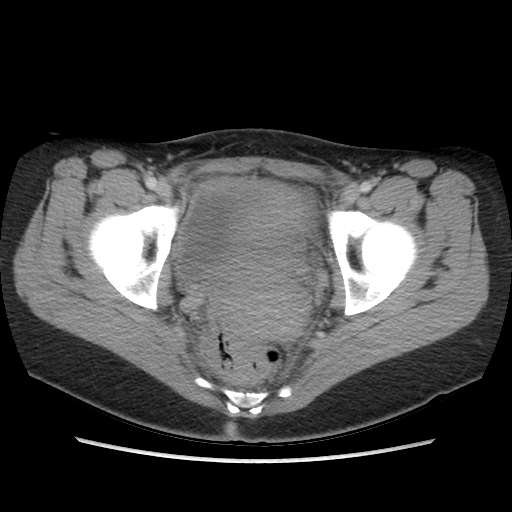
[im 9/82  bone]
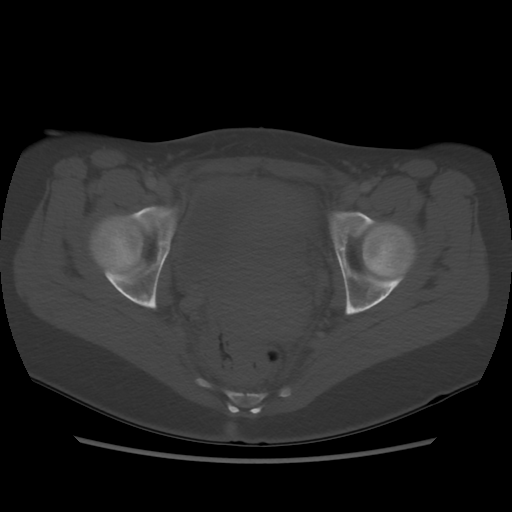
[im 17/82  soft-tissue]
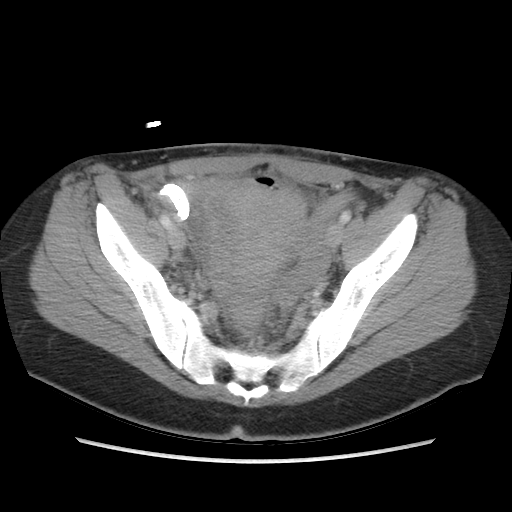
[im 25/82  soft-tissue]
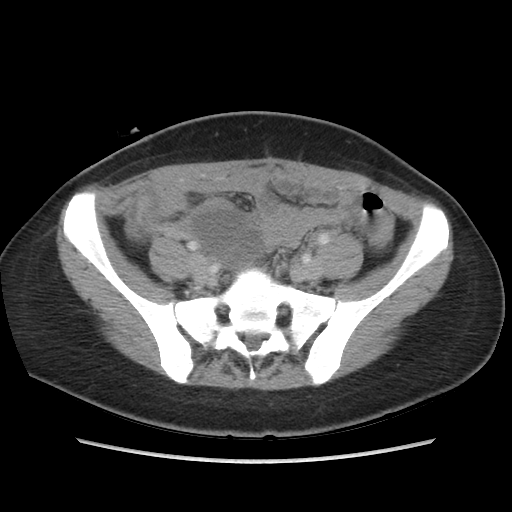
[im 33/82  soft-tissue]
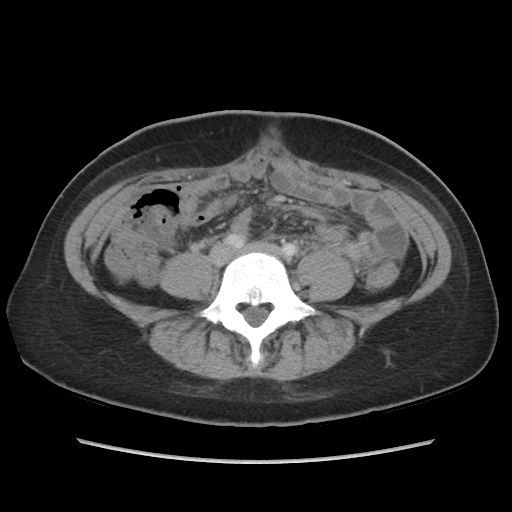
[im 41/82  soft-tissue]
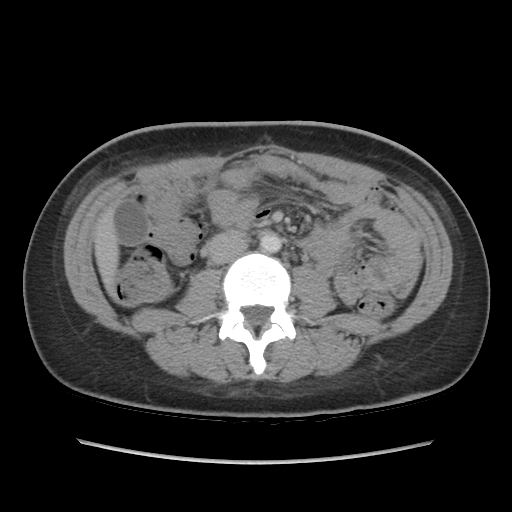
[im 49/82  soft-tissue]
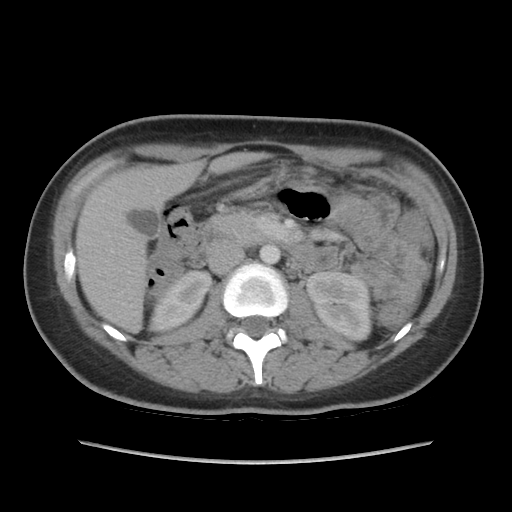
[im 49/82  lung]
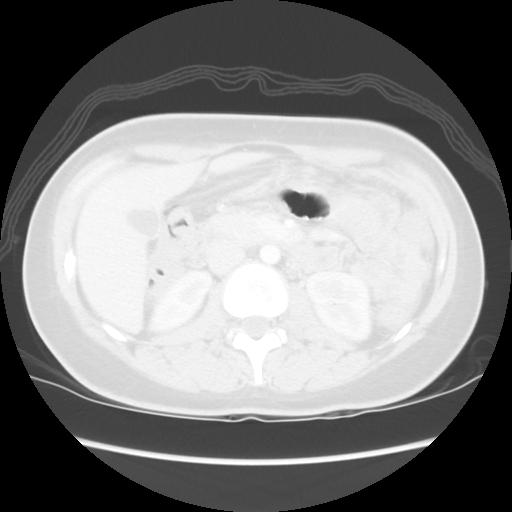
[im 57/82  soft-tissue]
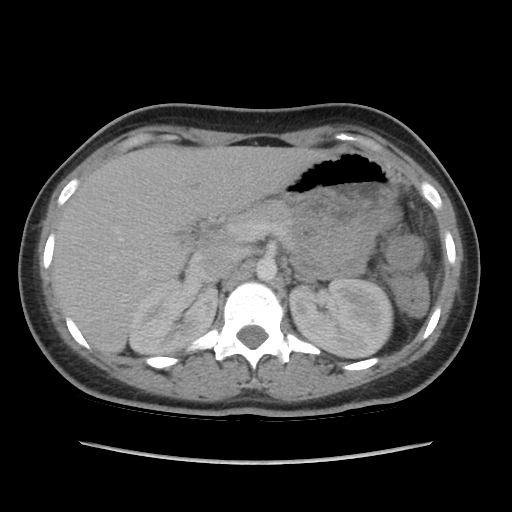
[im 57/82  lung]
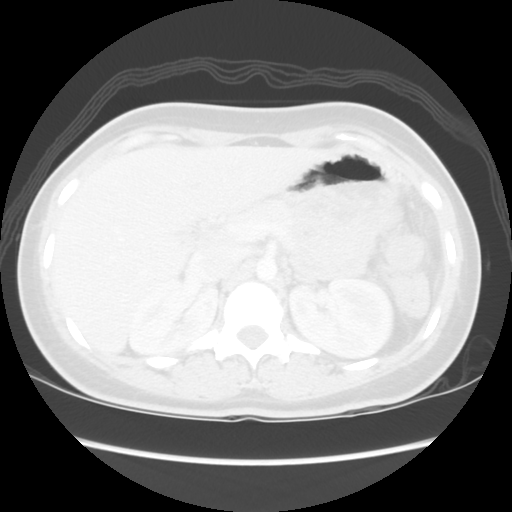
[im 65/82  soft-tissue]
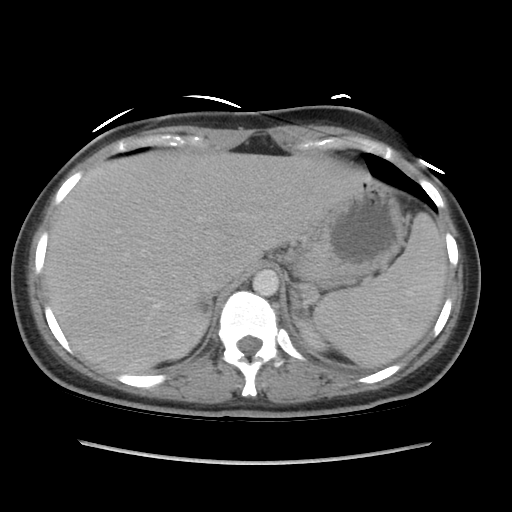
[im 65/82  lung]
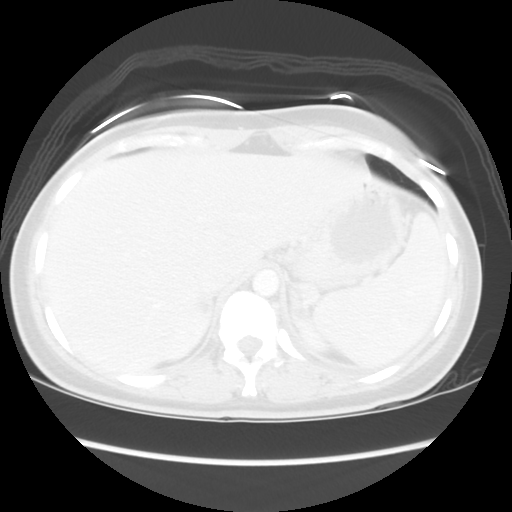
[im 73/82  soft-tissue]
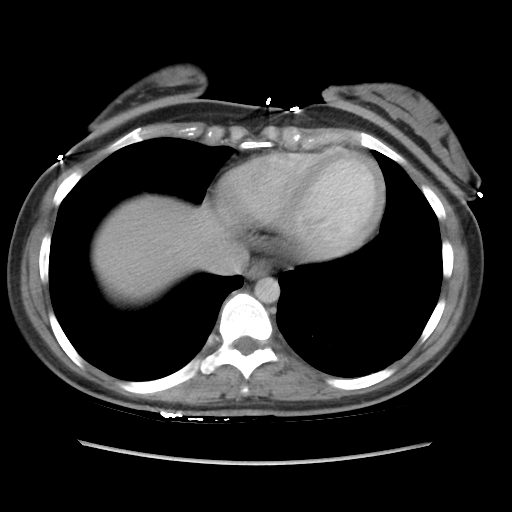
[im 73/82  lung]
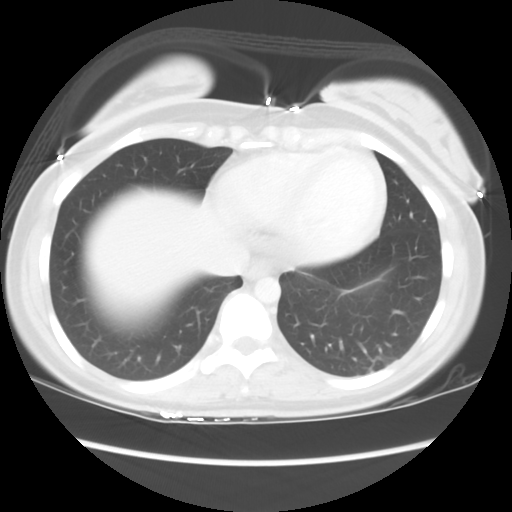
[im 73/82  bone]
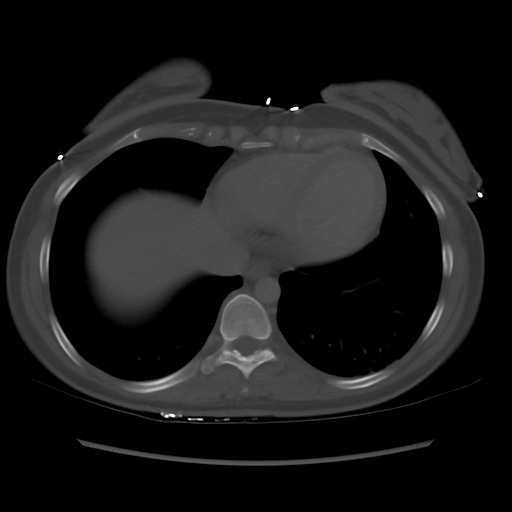

[Series 4: lung windows · axial · 0.66mm/px · 1 of 33 slices shown]
[im 11/33  bone]
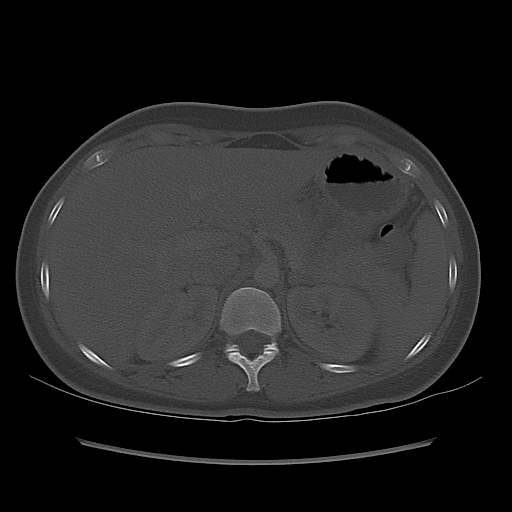

[Series 601: coronal body · coronal · 0.91mm/px · 1 of 107 slices shown, 2 images]
[im 36/107  soft-tissue]
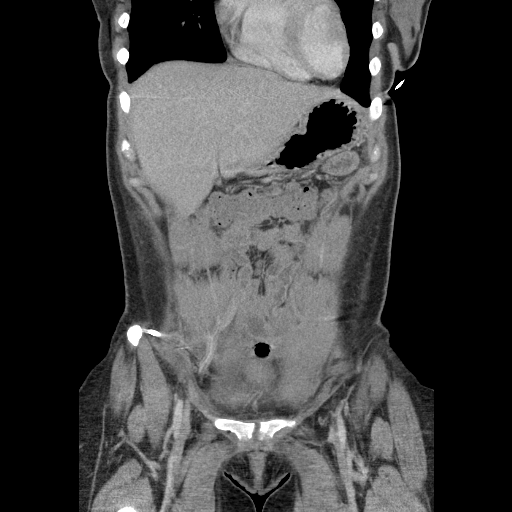
[im 36/107  bone]
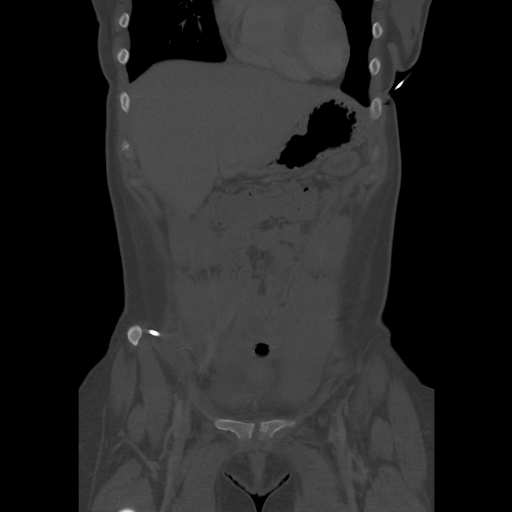

[11 of 36 positions shown; findings below may reference images not displayed]

FINDINGS: Lower chest:  No acute findings.

Hepatobiliary: No masses or other significant abnormality.

Pancreas: No mass, inflammatory changes, or other significant
abnormality.

Spleen: Within normal limits in size and appearance.

Adrenals/Urinary Tract: No masses identified. No evidence of
hydronephrosis. Urinary bladder nondistended.

Stomach/Bowel: Stomach, small bowel, and colon are nondilated.

Vascular/Lymphatic: No pathologically enlarged lymph nodes. No
evidence of abdominal aortic aneurysm.

Four

Reproductive: Some interval decrease in size of the postpartum
uterus. Low-attenuation then in endometrial cavity. Septated 5.4 cm
right adnexal cystic lesion, new since prior study.

Other: Interval resolution of the right antral lateral pelvic fluid
collection post percutaneous drain catheter placement. No residual
component. Trace residual ascites adjacent to the uterine fundus. No
free air.

Musculoskeletal:  No suspicious bone lesions identified.
IMPRESSION: 1. Interval resolution of right pelvic abscess post drain catheter
placement. No residual or recurrent undrained collections.
2. 5.4 cm right ovarian septated cyst, probable complex hemorrhagic
corpus luteal cyst; DDx also includes endometriosis; unlikely cystic
ovarian neoplasm given short timeframe of development - recommend
followup by US in 6-8 wks, during the week immediately following the
patient's normal menses. This recommendation follows ACR consensus
guidelines: White Paper of the ACR Incidental Findings Committee II
on Adnexal Findings. [HOSPITAL] [DATE].

## 2016-05-22 MED ORDER — IOPAMIDOL (ISOVUE-300) INJECTION 61%
100.0000 mL | Freq: Once | INTRAVENOUS | Status: AC | PRN
  Administered 2016-05-22: 100 mL via INTRAVENOUS

## 2016-05-22 NOTE — Progress Notes (Signed)
Patient ID: Heidi NatalSimone D Porreca, female   DOB: 07/04/1991, 25 y.o.   MRN: [id number]       Chief Complaint: Patient was seen in consultation today for abscess follow-up  At the request of Dillard,Naima  Referring Physician(s): Dillard,Naima  History of Present Illness: Heidi Ford is a 25 y.o. female, recently postpartum C-section, who presented with loculated right pelvic collection on CT of 05/07/2016. Percutaneous abscess drain catheter placed 05/11/2016. Follow-up scan today shows resolution of the collection. She's having less than 20 mL of thin old blood out of the drain daily. No fevers. No pain. She continues on her antibiotic regimen. Baby is doing well. Past Medical History  Diagnosis Date  . Anxiety   . Childhood asthma     Resolved  . Hyperthyroidism     "over active thyroid"  . Depression     Past Surgical History  Procedure Laterality Date  . Wisdom tooth extraction    . Cesarean section N/A 04/16/2016    Procedure: CESAREAN SECTION;  Surgeon: Jaymes GraffNaima Dillard, MD;  Location: [facility];  Service: Obstetrics;  Laterality: N/A;    Allergies: Review of patient's allergies indicates no known allergies.  Medications: Prior to Admission medications   Medication Sig Start Date End Date Taking? Authorizing Provider  ciprofloxacin (CIPRO) 750 MG tablet Take 1 tablet (750 mg total) by mouth 2 (two) times daily. 05/12/16   Myna HidalgoJennifer Ozan, DO  clindamycin (CLEOCIN) 300 MG capsule Take 3 tablets every 8 hours 05/12/16   Myna HidalgoJennifer Ozan, DO  ferrous sulfate 325 (65 FE) MG tablet Take 1 tablet (325 mg total) by mouth 2 (two) times daily with a meal. 04/19/16   Silverio LaySandra Rivard, MD  ibuprofen (ADVIL,MOTRIN) 100 MG/5ML suspension Take 30 mLs (600 mg total) by mouth every 6 (six) hours. Patient not taking: Reported on 05/07/2016 04/19/16   Silverio LaySandra Rivard, MD  oxyCODONE-acetaminophen (PERCOCET/ROXICET) 5-325 MG tablet Take 1 tablet by mouth every 6 (six) hours as needed (for pain scale 4-7).  05/12/16   Myna HidalgoJennifer Ozan, DO  Prenatal Vit-Fe Fumarate-FA (PRENATAL MULTIVITAMIN) TABS tablet Take 1 tablet by mouth daily at 12 noon.    Historical Provider, MD     Family History  Problem Relation Age of Onset  . Healthy Mother     Living  . Healthy Father     Living  . Diabetes Maternal Grandmother   . Diabetes Maternal Aunt   . Healthy Sister     x1  . Asthma Sister     #2-Resolved    Social History   Social History  . Marital Status: Single    Spouse Name: N/A  . Number of Children: N/A  . Years of Education: N/A   Social History Main Topics  . Smoking status: Never Smoker   . Smokeless tobacco: Never Used  . Alcohol Use: No  . Drug Use: No  . Sexual Activity: Not Currently    Birth Control/ Protection: None   Other Topics Concern  . Not on file   Social History Narrative    ECOG Status: 1 - Symptomatic but completely ambulatory  Review of Systems: A 12 point ROS discussed and pertinent positives are indicated in the HPI above.  All other systems are negative.  Review of Systems  Vital Signs: BP 122/69 mmHg  Pulse 70  Temp(Src) 98.3 F (36.8 C)  SpO2 98%  Physical Exam  Mallampati Score:     Imaging: Ct Abdomen Pelvis W Contrast  05/07/2016  CLINICAL DATA:  25 year old female status post recent C-section presenting with right lower quadrant abdominal pain. EXAM: CT ABDOMEN AND PELVIS WITH CONTRAST TECHNIQUE: Multidetector CT imaging of the abdomen and pelvis was performed using the standard protocol following bolus administration of intravenous contrast. CONTRAST:  ISOVUE-300 IOPAMIDOL (ISOVUE-300) INJECTION 61% COMPARISON:  None. FINDINGS: Partially visualized small bilateral pleural effusions. The visualized lung bases are clear. No intra-abdominal free air. Small free fluid noted within the pelvis. The liver, gallbladder, pancreas, spleen, adrenal glands, kidneys, visualized ureters appear unremarkable. There is diffuse haziness of the  bladder wall, likely reactive to inflammatory changes of the pelvis. Cystitis is less likely but not excluded. Correlation with urinalysis recommended. The uterus is anteverted, enlarged and heterogeneous. A C-section incision noted in the lower anterior uterine wall. The visualized ovaries appear unremarkable. There is a 4.2 x 4.8 cm fluid collection with thick enhancing wall in the right hemipelvis, inferior and lateral to the right ovary, compatible with an abscess. Small amount of fluid in the pelvis concerning for infected fluid. There is apparent enhancement of the pelvic peritoneal surface. Clinical correlation is recommended to evaluate for peritonitis. Oral contrast opacifies the stomach and multiple loops of small bowel. There is no evidence of bowel obstruction. There is moderate stool throughout the colon. The visualized appendix fills with air and appear unremarkable. There is diastases of anterior abdominal wall musculature in the midline. There is focal area of periumbilical subcutaneous haziness, likely related to surgical port placement. No fluid collection or hematoma. The osseous structures are intact. The abdominal aorta and IVC appear unremarkable. No portal venous gas identified. There is no adenopathy. IMPRESSION: Loculated fluid collection/ abscess in the right pelvis. Small amount of free fluid within the pelvis concerning for infected fluid. There is enhancement of the pelvic peritoneal surface. Correlation with clinical exam recommended to evaluate for peritonitis. The visualized portions of the appendix appear unremarkable. The appendix is not entirely visualized due to inflammatory changes and abscess of the right hemipelvis. The pelvic abscess however is less likely felt to be secondary to appendicitis. Electronically Signed   By: Elgie Collard M.D.   On: 05/07/2016 13:36   Ct Image Guided Drainage By Percutaneous Catheter  05/12/2016  INDICATION: Right lower quadrant abscess.   Post C-section. EXAM: CT GUIDED DRAINAGE OF A RIGHT LOWER QUADRANT PELVIC ABSCESS ABSCESS MEDICATIONS: The patient is currently admitted to the hospital and receiving intravenous antibiotics. The antibiotics were administered within an appropriate time frame prior to the initiation of the procedure. ANESTHESIA/SEDATION: Six mg IV Versed 150 mcg IV Fentanyl Moderate Sedation Time:  23 The patient was continuously monitored during the procedure by the interventional radiology nurse under my direct supervision. COMPLICATIONS: None immediate. TECHNIQUE: Informed written consent was obtained from the patient after a thorough discussion of the procedural risks, benefits and alternatives. All questions were addressed. Maximal Sterile Barrier Technique was utilized including caps, mask, sterile gowns, sterile gloves, sterile drape, hand hygiene and skin antiseptic. A timeout was performed prior to the initiation of the procedure. PROCEDURE: The right lower quadrant was prepped with ChloraPrep in a sterile fashion, and a sterile drape was applied covering the operative field. A sterile gown and sterile gloves were used for the procedure. Local anesthesia was provided with 1% Lidocaine. Under CT guidance, an 18 gauge needle was inserted into the right lower quadrant and pelvic abscess. It was removed over an Amplatz wire. A 10 French dilator followed by a 10 [place] drain were inserted. It was looped and  string fixed. Frank pus was aspirated. FINDINGS: Images document placement of a 10 French right lower quadrant abscess drain. IMPRESSION: Successful 10 French right lower quadrant abscess drain. Electronically Signed   By: Jolaine ClickArthur  Hoss M.D.   On: 05/12/2016 13:56    Labs:  CBC:  Recent Labs  04/17/16 0504 05/07/16 1100 05/08/16 0512 05/11/16 0952  WBC 9.7 12.6* 9.2 10.3  HGB 9.1* 9.5* 8.8* 7.8*  HCT 27.1* 29.6* 27.1* 24.1*  PLT 145* 336 295 367    COAGS:  Recent Labs  05/11/16 0952  INR 1.14     BMP:  Recent Labs  09/05/15 1510 05/07/16 1100  NA 134* 137  K 3.1* 3.3*  CL 102 105  CO2 24 24  GLUCOSE 107* 108*  BUN 12 8  CALCIUM 9.6 8.7*  CREATININE 0.56 0.57  GFRNONAA >60 >60  GFRAA >60 >60    LIVER FUNCTION TESTS:  Recent Labs  09/05/15 1510 05/07/16 1100  BILITOT 0.9 1.0  AST 16 11*  ALT 15 9*  ALKPHOS 39 94  PROT 7.4 7.1  ALBUMIN 4.1 3.0*    TUMOR MARKERS: No results for input(s): AFPTM, CEA, CA199, CHROMGRNA in the last 8760 hours.  Assessment and Plan:  My impression is that this patient has had successful resolution of the right lower quadrant pelvic abscess post Perkins drain catheter placement. There is no residual undrained fluid collection. Outputs are minimal. Given history, low risk of fistula bowel. Catheter can be removed.  Catheter was removed uneventfully and a gauze dressing placed over the site.  Thank you for this interesting consult.  I greatly enjoyed meeting Heidi NatalSimone D Franchi and look forward to participating in their care.  A copy of this report was sent to the requesting provider on this date.  Electronically Signed: Shaleigh Laubscher III, DAYNE Jordynn Marcella 05/22/2016, 11:28 AM   I spent a total of    15 Minutes in face to face in clinical consultation, greater than 50% of which was counseling/coordinating care for abscess drain catheter.

## 2016-06-04 ENCOUNTER — Inpatient Hospital Stay: Payer: Federal, State, Local not specified - PPO | Admitting: Internal Medicine

## 2016-12-24 ENCOUNTER — Ambulatory Visit: Payer: Medicaid Other | Admitting: Medical

## 2016-12-25 ENCOUNTER — Ambulatory Visit: Payer: Medicaid Other | Admitting: Medical

## 2016-12-25 ENCOUNTER — Encounter: Payer: Self-pay | Admitting: Family Medicine

## 2016-12-25 ENCOUNTER — Ambulatory Visit (INDEPENDENT_AMBULATORY_CARE_PROVIDER_SITE_OTHER): Payer: BLUE CROSS/BLUE SHIELD | Admitting: Family Medicine

## 2016-12-25 VITALS — BP 93/67 | HR 99 | Temp 98.9°F | Resp 16 | Ht 68.0 in | Wt 154.8 lb

## 2016-12-25 DIAGNOSIS — B001 Herpesviral vesicular dermatitis: Secondary | ICD-10-CM | POA: Diagnosis not present

## 2016-12-25 MED ORDER — VALACYCLOVIR HCL 500 MG PO TABS: 500.0000 mg | ORAL_TABLET | Freq: Every day | ORAL | 0 refills | Status: AC

## 2016-12-25 MED ORDER — VALACYCLOVIR HCL 1 G PO TABS: ORAL_TABLET | ORAL | 0 refills | Status: AC

## 2016-12-25 NOTE — Progress Notes (Signed)
OFFICE VISIT  12/25/2016   CC:  Chief Complaint  Patient presents with  . Mouth Lesions    nose and mouth   HPI:    Patient is a 26 y.o. Caucasian female who presents for sores in mouth/nose. 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 Last few months has been having them more frequently.  Extra stressors lately: pregnancy, stress of school and being a mother.   No fevers, no malaise.  Usually uses abreva.  Says she has never been prescribed any med for this in the past. They usually are present about 5 days and then spontaneously resolve.  She describes them as feeling uncomfortable but not painful.  Just slight itching sensation.  Sometimes feels a little tingling prior to seeing the sores come out.  ROS: she is currently menstruating  Past Medical History:  Diagnosis Date  . Anxiety   . Childhood asthma    Resolved  . Depression   . Hyperthyroidism    "over active thyroid"    Past Surgical History:  Procedure Laterality Date  . CESAREAN SECTION N/A 04/16/2016   Procedure: CESAREAN SECTION;  Surgeon: Jaymes Graff, MD;  Location: [facility];  Service: Obstetrics;  Laterality: N/A;  . WISDOM TOOTH EXTRACTION      Outpatient Medications Prior to Visit  Medication Sig Dispense Refill  . ciprofloxacin (CIPRO) 750 MG tablet Take 1 tablet (750 mg total) by mouth 2 (two) times daily. (Patient not taking: Reported on 12/25/2016) 42 tablet 0  . clindamycin (CLEOCIN) 300 MG capsule Take 3 tablets every 8 hours (Patient not taking: Reported on 12/25/2016) 189 capsule 0  . ferrous sulfate 325 (65 FE) MG tablet Take 1 tablet (325 mg total) by mouth 2 (two) times daily with a meal. (Patient not taking: Reported on 12/25/2016) 60 tablet 3  . ibuprofen (ADVIL,MOTRIN) 100 MG/5ML suspension Take 30 mLs (600 mg total) by mouth every 6 (six) hours. (Patient not taking: Reported on 05/07/2016) 473 mL 5  . oxyCODONE-acetaminophen (PERCOCET/ROXICET) 5-325 MG  tablet Take 1 tablet by mouth every 6 (six) hours as needed (for pain scale 4-7). (Patient not taking: Reported on 12/25/2016) 30 tablet 0  . Prenatal Vit-Fe Fumarate-FA (PRENATAL MULTIVITAMIN) TABS tablet Take 1 tablet by mouth daily at 12 noon.     No facility-administered medications prior to visit.     No Known Allergies  ROS As per HPI  PE: Blood pressure 93/67, pulse 99, temperature 98.9 F (37.2 C), temperature source Oral, resp. rate 16, height 5\' 8"  (1.727 m), weight 154 lb 12 oz (70.2 kg), last menstrual period 12/23/2016, SpO2 99 %, not currently breastfeeding. Gen: Alert, well appearing.  Patient is oriented to person, place, time, and situation. AFFECT: pleasant, lucid thought and speech. Several small, fluid-filled vesicles are noted on upper lip--no erythema.  I cannot see any discreet vesicles or ulcers in nose although the mucosa does look erythematous diffusely.  Mouth is without lesion, oral mucosa and tongue normal.   LABS:  none  IMPRESSION AND PLAN:  1) Recurrent herpes labialis: obtained swab of the fluid from one of the vesicles today--albeit a very small amount--to send for HSV culture. Given frequent recurrences the last few months, I decided to treat with Valtrex 2g q12h x 2 doses AND follow this with 1 month of valtrex 500mg  qd suppression therapy.  An After Visit Summary was printed and given to the patient.  FOLLOW UP: Return if symptoms worsen  or fail to improve.  Signed:  Santiago BumpersPhil McGowen, MD           12/25/2016

## 2016-12-25 NOTE — Progress Notes (Signed)
Pre visit review using our clinic review tool, if applicable. No additional management support is needed unless otherwise documented below in the visit note. 

## 2016-12-27 LAB — HERPES SIMPLEX VIRUS CULTURE: ORGANISM ID, BACTERIA: DETECTED

## 2017-02-27 DIAGNOSIS — H04123 Dry eye syndrome of bilateral lacrimal glands: Secondary | ICD-10-CM | POA: Diagnosis not present

## 2017-04-20 DIAGNOSIS — S01511A Laceration without foreign body of lip, initial encounter: Secondary | ICD-10-CM | POA: Diagnosis not present

## 2017-04-20 DIAGNOSIS — S0181XA Laceration without foreign body of other part of head, initial encounter: Secondary | ICD-10-CM | POA: Diagnosis not present

## 2017-04-20 DIAGNOSIS — M25522 Pain in left elbow: Secondary | ICD-10-CM | POA: Diagnosis not present

## 2017-04-20 DIAGNOSIS — S0990XA Unspecified injury of head, initial encounter: Secondary | ICD-10-CM | POA: Diagnosis not present

## 2017-04-20 DIAGNOSIS — J329 Chronic sinusitis, unspecified: Secondary | ICD-10-CM | POA: Diagnosis not present

## 2017-04-20 DIAGNOSIS — S0993XA Unspecified injury of face, initial encounter: Secondary | ICD-10-CM | POA: Diagnosis not present

## 2017-04-25 DIAGNOSIS — Z4802 Encounter for removal of sutures: Secondary | ICD-10-CM | POA: Diagnosis not present

## 2017-06-01 DIAGNOSIS — H04123 Dry eye syndrome of bilateral lacrimal glands: Secondary | ICD-10-CM | POA: Diagnosis not present

## 2017-06-10 DIAGNOSIS — H578 Other specified disorders of eye and adnexa: Secondary | ICD-10-CM | POA: Diagnosis not present

## 2017-06-19 DIAGNOSIS — F411 Generalized anxiety disorder: Secondary | ICD-10-CM | POA: Diagnosis not present

## 2017-06-19 DIAGNOSIS — H10013 Acute follicular conjunctivitis, bilateral: Secondary | ICD-10-CM | POA: Diagnosis not present

## 2017-07-11 ENCOUNTER — Telehealth: Payer: Self-pay | Admitting: Emergency Medicine

## 2017-07-11 NOTE — Telephone Encounter (Signed)
LMOVM advising patient is overdue for CPE. Please call back to schedule an appointment

## 2017-07-15 ENCOUNTER — Telehealth: Payer: Self-pay | Admitting: Medical

## 2017-07-15 ENCOUNTER — Telehealth: Payer: Self-pay | Admitting: Physician Assistant

## 2017-07-15 NOTE — Telephone Encounter (Signed)
Ok with me 

## 2017-07-15 NOTE — Telephone Encounter (Signed)
Patient would like to transfer from [place] to [place], please advise °

## 2017-07-15 NOTE — Telephone Encounter (Signed)
Ok with me but don't actually switch to me until she actually makes appointment with me. See if agrees to physical since looks like she might need one.

## 2017-07-16 ENCOUNTER — Telehealth: Payer: Self-pay | Admitting: Medical

## 2017-07-16 ENCOUNTER — Encounter: Payer: Self-pay | Admitting: Medical

## 2017-07-16 ENCOUNTER — Ambulatory Visit (INDEPENDENT_AMBULATORY_CARE_PROVIDER_SITE_OTHER): Payer: BLUE CROSS/BLUE SHIELD | Admitting: Medical

## 2017-07-16 VITALS — BP 116/62 | HR 66 | Temp 98.8°F | Resp 16 | Ht 68.0 in | Wt 166.6 lb

## 2017-07-16 DIAGNOSIS — Z23 Encounter for immunization: Secondary | ICD-10-CM

## 2017-07-16 DIAGNOSIS — Z0000 Encounter for general adult medical examination without abnormal findings: Secondary | ICD-10-CM

## 2017-07-16 DIAGNOSIS — Z113 Encounter for screening for infections with a predominantly sexual mode of transmission: Secondary | ICD-10-CM | POA: Diagnosis not present

## 2017-07-16 LAB — CBC WITH DIFFERENTIAL/PLATELET
Basophils Absolute: 0 10*3/uL (ref 0.0–0.1)
Basophils Relative: 0.5 % (ref 0.0–3.0)
EOS PCT: 1.5 % (ref 0.0–5.0)
Eosinophils Absolute: 0.1 10*3/uL (ref 0.0–0.7)
HCT: 36.5 % (ref 36.0–46.0)
HEMOGLOBIN: 11.8 g/dL — AB (ref 12.0–15.0)
LYMPHS PCT: 33.2 % (ref 12.0–46.0)
Lymphs Abs: 1.5 10*3/uL (ref 0.7–4.0)
MCHC: 32.4 g/dL (ref 30.0–36.0)
MCV: 94 fl (ref 78.0–100.0)
MONO ABS: 0.3 10*3/uL (ref 0.1–1.0)
Monocytes Relative: 7 % (ref 3.0–12.0)
Neutro Abs: 2.6 10*3/uL (ref 1.4–7.7)
Neutrophils Relative %: 57.8 % (ref 43.0–77.0)
Platelets: 235 10*3/uL (ref 150.0–400.0)
RBC: 3.89 Mil/uL (ref 3.87–5.11)
RDW: 13.2 % (ref 11.5–15.5)
WBC: 4.5 10*3/uL (ref 4.0–10.5)

## 2017-07-16 LAB — COMPREHENSIVE METABOLIC PANEL
ALBUMIN: 4 g/dL (ref 3.5–5.2)
ALK PHOS: 40 U/L (ref 39–117)
ALT: 7 U/L (ref 0–35)
AST: 11 U/L (ref 0–37)
BUN: 12 mg/dL (ref 6–23)
CO2: 25 mEq/L (ref 19–32)
CREATININE: 0.73 mg/dL (ref 0.40–1.20)
Calcium: 9.1 mg/dL (ref 8.4–10.5)
Chloride: 108 mEq/L (ref 96–112)
GFR: 122.3 mL/min (ref >60.00)
Glucose, Bld: 90 mg/dL (ref 70–99)
POTASSIUM: 3.4 meq/L — AB (ref 3.5–5.1)
SODIUM: 139 meq/L (ref 135–145)
TOTAL PROTEIN: 7 g/dL (ref 6.0–8.3)
Total Bilirubin: 0.6 mg/dL (ref 0.2–1.2)

## 2017-07-16 LAB — LIPID PANEL
CHOLESTEROL: 85 mg/dL (ref 0–200)
HDL: 40.9 mg/dL (ref >39.00)
LDL Cholesterol: 37 mg/dL (ref 0–99)
NonHDL: 44.53
Total CHOL/HDL Ratio: 2
Triglycerides: 38 mg/dL (ref 0.0–149.0)
VLDL: 7.6 mg/dL (ref 0.0–40.0)

## 2017-07-16 LAB — HIV ANTIBODY (ROUTINE TESTING W REFLEX): HIV: NONREACTIVE

## 2017-07-16 NOTE — Patient Instructions (Addendum)
For you wellness exam today I have ordered cbc, cmp, lipid panel, ua and hiv.   Vaccine given today tdap. And ppd given.  Recommend exercise and healthy diet.  We will let you know lab results as they come in.  Will you call you gyn and ask when due for pap. Have them fax [place] results of last.  Follow up date appointment will be determined after lab review.    Preventive Care 18-39 Years, Female Preventive care refers to lifestyle choices and visits with your health care provider that can promote health and wellness. What does preventive care include?  A yearly physical exam. This is also called an annual well check.  Dental exams once or twice a year.  Routine eye exams. Ask your health care provider how often you should have your eyes checked.  Personal lifestyle choices, including: ? Daily care of your teeth and gums. ? Regular physical activity. ? Eating a healthy diet. ? Avoiding tobacco and drug use. ? Limiting alcohol use. ? Practicing safe sex. ? Taking vitamin and mineral supplements as recommended by your health care provider. What happens during an annual well check? The services and screenings done by your health care provider during your annual well check will depend on your age, overall health, lifestyle risk factors, and family history of disease. Counseling Your health care provider may ask you questions about your:  Alcohol use.  Tobacco use.  Drug use.  Emotional well-being.  Home and relationship well-being.  Sexual activity.  Eating habits.  Work and work Astronomer.  Method of birth control.  Menstrual cycle.  Pregnancy history.  Screening You may have the following tests or measurements:  Height, weight, and BMI.  Diabetes screening. This is done by checking your blood sugar (glucose) after you have not eaten for a while (fasting).  Blood pressure.  Lipid and cholesterol levels. These may be checked every 5 years starting at age  39.  Skin check.  Hepatitis C blood test.  Hepatitis B blood test.  Sexually transmitted disease (STD) testing.  BRCA-related cancer screening. This may be done if you have a family history of breast, ovarian, tubal, or peritoneal cancers.  Pelvic exam and Pap test. This may be done every 3 years starting at age 69. Starting at age 40, this may be done every 5 years if you have a Pap test in combination with an HPV test.  Discuss your test results, treatment options, and if necessary, the need for more tests with your health care provider. Vaccines Your health care provider may recommend certain vaccines, such as:  Influenza vaccine. This is recommended every year.  Tetanus, diphtheria, and acellular pertussis (Tdap, Td) vaccine. You may need a Td booster every 10 years.  Varicella vaccine. You may need this if you have not been vaccinated.  HPV vaccine. If you are 43 or younger, you may need three doses over 6 months.  Measles, mumps, and rubella (MMR) vaccine. You may need at least one dose of MMR. You may also need a second dose.  Pneumococcal 13-valent conjugate (PCV13) vaccine. You may need this if you have certain conditions and were not previously vaccinated.  Pneumococcal polysaccharide (PPSV23) vaccine. You may need one or two doses if you smoke cigarettes or if you have certain conditions.  Meningococcal vaccine. One dose is recommended if you are age 50-21 years and a first-year college student living in a residence hall, or if you have one of several medical conditions. You may  also need additional booster doses.  Hepatitis A vaccine. You may need this if you have certain conditions or if you travel or work in places where you may be exposed to hepatitis A.  Hepatitis B vaccine. You may need this if you have certain conditions or if you travel or work in places where you may be exposed to hepatitis B.  Haemophilus influenzae type b (Hib) vaccine. You may need this if  you have certain risk factors.  Talk to your health care provider about which screenings and vaccines you need and how often you need them. This information is not intended to replace advice given to you by your health care provider. Make sure you discuss any questions you have with your health care provider. Document Released: 01/08/2002 Document Revised: 08/01/2016 Document Reviewed: 09/13/2015 Elsevier Interactive Patient Education  2017 [employer].

## 2017-07-16 NOTE — Progress Notes (Signed)
Subjective:    Patient ID: Heidi Ford, female    DOB: 1991/09/30, 26 y.o.   MRN: [id number]  HPI  Pt in for a physical.  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  Pt feels well today. No acute problems.  Pt is fasting.  Pt is due to tdap.(pt years ago went to different pediatricians).  Pt will get flu vaccine this year. Recommend.   Pt has daughter one year. Pt does not exercise. Healthy diet, non smoker, rare glass of wine. LMP- 2 weeks ago.  Last pap- maybe a year ago. Pt not sure.   Review of Systems  Constitutional: Negative for chills, fatigue and fever.  HENT: Negative for congestion, ear discharge, ear pain, facial swelling, nosebleeds and postnasal drip.   Respiratory: Negative for cough, chest tightness, shortness of breath and wheezing.   Cardiovascular: Negative for chest pain and palpitations.  Gastrointestinal: Negative for abdominal pain, blood in stool, diarrhea and vomiting.  Genitourinary: Negative for dysuria, flank pain, frequency, pelvic pain and urgency.  Musculoskeletal: Negative for back pain and neck pain.  Skin: Negative for rash.  Neurological: Negative for dizziness, syncope, speech difficulty and headaches.  Hematological: Negative for adenopathy. Does not bruise/bleed easily.  Psychiatric/Behavioral: Negative for behavioral problems, confusion and sleep disturbance. The patient is not nervous/anxious.     Past Medical History:  Diagnosis Date  . Anxiety   . Childhood asthma    Resolved  . Depression   . Hyperthyroidism    "over active thyroid"     Social History   Social History  . Marital status: Single    Spouse name: N/A  . Number of children: N/A  . Years of education: N/A   Occupational History  . Not on file.   Social History Main Topics  . Smoking status: Never Smoker  . Smokeless tobacco: Never Used  . Alcohol use No  . Drug use: No  . Sexual activity: Not Currently    Birth control/ protection: None   Other Topics  Concern  . Not on file   Social History Narrative  . No narrative on file    Past Surgical History:  Procedure Laterality Date  . CESAREAN SECTION N/A 04/16/2016   Procedure: CESAREAN SECTION;  Surgeon: Jaymes Graff, MD;  Location: [facility];  Service: Obstetrics;  Laterality: N/A;  . WISDOM TOOTH EXTRACTION      Family History  Problem Relation Age of Onset  . Healthy Mother        Living  . Healthy Father        Living  . Diabetes Maternal Grandmother   . Diabetes Maternal Aunt   . Healthy Sister        x1  . Asthma Sister        #2-Resolved    No Known Allergies  Current Outpatient Prescriptions on File Prior to Visit  Medication Sig Dispense Refill  . valACYclovir (VALTREX) 1000 MG tablet 2 tabs po q12h x 2 doses for a recurrence of cold sores 20 tablet 0  . valACYclovir (VALTREX) 500 MG tablet Take 1 tablet (500 mg total) by mouth daily. 30 tablet 0   No current facility-administered medications on file prior to visit.     BP 116/62   Pulse 66   Temp 98.8 F (37.1 C) (Oral)   Resp 16   Ht 5\' 8"  (1.727 m)   Wt 166 lb 9.6 oz (75.6 kg)   LMP 07/02/2017   BMI  25.33 kg/m      Objective:   Physical Exam  General  Mental Status - Alert. General Appearance - Well groomed. Not in acute distress.  Skin Rashes- No Rashes.  HEENT Head- Normal. Ear Auditory Canal - Left- Normal. Right - Normal.Tympanic Membrane- Left- Normal. Right- Normal. Eye Sclera/Conjunctiva- Left- Normal. Right- Normal. Nose & Sinuses Nasal Mucosa- Left-  Boggy and Congested. Right-  Boggy and  Congested.Bilateral  No maxillary and  No frontal sinus pressure. Mouth & Throat Lips: Upper Lip- Normal: no dryness, cracking, pallor, cyanosis, or vesicular eruption. Lower Lip-Normal: no dryness, cracking, pallor, cyanosis or vesicular eruption. Buccal Mucosa- Bilateral- No Aphthous ulcers. Oropharynx- No Discharge or Erythema. Tonsils: Characteristics- Bilateral- No Erythema or  Congestion. Size/Enlargement- Bilateral- No enlargement. Discharge- bilateral-None.  Neck Neck- Supple. No Masses.   Chest and Lung Exam Auscultation: Breath Sounds:-Clear even and unlabored.  Cardiovascular Auscultation:Rythm- Regular, rate and rhythm. Murmurs & Other Heart Sounds:Ausculatation of the heart reveal- No Murmurs.  Lymphatic Head & Neck General Head & Neck Lymphatics: Bilateral: Description- No Localized lymphadenopathy.  Abdomen- soft, nt, nd, +bs, no rebound or guarding.   Neurologic Cranial Nerve exam:- CN III-XII intact(No nystagmus), symmetric smile. Strength:- 5/5 equal and symmetric strength both upper and lower extremities.     Assessment & Plan:  For you wellness exam today I have ordered cbc, cmp, tsh, lipid panel, ua and hiv.  Vaccine given today tdap and ppd given  Recommend exercise and healthy diet.  We will let you know lab results as they come in.  Follow up date appointment will be determined after lab review.   School form to be filled out pending ppd review.   Kimberlyann Hollar, Ramon Dredge, PA-C

## 2017-07-16 NOTE — Telephone Encounter (Signed)
Saguier, Ramon Dredge, PA-C 11 hours ago (9:23 PM)      Ok with me but don't actually switch to me until she actually makes appointment with me. See if agrees to physical since looks like she might need one.

## 2017-07-16 NOTE — Addendum Note (Signed)
Addended by: Orlene Och on: 07/16/2017 11:44 AM   Modules accepted: Orders

## 2017-07-16 NOTE — Telephone Encounter (Signed)
Urine order placed for wellness exam

## 2017-07-18 ENCOUNTER — Ambulatory Visit: Payer: BLUE CROSS/BLUE SHIELD

## 2017-07-18 ENCOUNTER — Encounter: Payer: BLUE CROSS/BLUE SHIELD | Admitting: Behavioral Health

## 2017-07-18 LAB — TB SKIN TEST
INDURATION: 0 mm
TB SKIN TEST: NEGATIVE

## 2017-07-18 NOTE — Progress Notes (Deleted)
Pre visit review using our clinic review tool, if applicable. No additional management support is needed unless otherwise documented below in the visit note. 

## 2017-07-26 ENCOUNTER — Ambulatory Visit (INDEPENDENT_AMBULATORY_CARE_PROVIDER_SITE_OTHER): Payer: BLUE CROSS/BLUE SHIELD | Admitting: Family Medicine

## 2017-07-26 ENCOUNTER — Encounter: Payer: Self-pay | Admitting: Family Medicine

## 2017-07-26 ENCOUNTER — Ambulatory Visit: Payer: BLUE CROSS/BLUE SHIELD | Admitting: Medical

## 2017-07-26 VITALS — BP 108/68 | HR 81 | Temp 98.8°F | Ht 68.0 in | Wt 162.2 lb

## 2017-07-26 DIAGNOSIS — B9789 Other viral agents as the cause of diseases classified elsewhere: Secondary | ICD-10-CM

## 2017-07-26 DIAGNOSIS — Z72820 Sleep deprivation: Secondary | ICD-10-CM

## 2017-07-26 DIAGNOSIS — J069 Acute upper respiratory infection, unspecified: Secondary | ICD-10-CM | POA: Diagnosis not present

## 2017-07-26 DIAGNOSIS — F411 Generalized anxiety disorder: Secondary | ICD-10-CM | POA: Diagnosis not present

## 2017-07-26 DIAGNOSIS — F419 Anxiety disorder, unspecified: Secondary | ICD-10-CM | POA: Diagnosis not present

## 2017-07-26 DIAGNOSIS — F329 Major depressive disorder, single episode, unspecified: Secondary | ICD-10-CM

## 2017-07-26 MED ORDER — FLUTICASONE PROPIONATE 50 MCG/ACT NA SUSP: 2.0000 | Freq: Every day | NASAL | 2 refills | Status: AC

## 2017-07-26 NOTE — Progress Notes (Signed)
Chief Complaint  Patient presents with  . Nasal Congestion  . Sore Throat    Heidi NatalSimone D Sneeringer here for URI complaints.  Duration: 1 week  Associated symptoms: sinus congestion, rhinorrhea, sore throat and myalgia Denies: itchy watery eyes, ear pain, ear drainage, wheezing, shortness of breath and fevers/rigors Treatment to date: fluids, Vit C, robitussin Sick contacts: No  She feels that she has been having more frequent episodes of upper respiratory infections, periods of achiness, and getting sick more frequently. This has happened since her daughter was born around 1 year ago. She has noticed her sleep is much poorer and interrupted. She slept very well before this. She really will have fevers. She does follow with a therapist. She has been on Prozac in the past, however notes it did not make her feel good. She is not currently taking anything for her anxiety/depression.  ROS:  Const: Denies fevers HEENT: As noted in HPI Lungs: No SOB  Past Medical History:  Diagnosis Date  . Anxiety   . Childhood asthma    Resolved  . Depression   . Hyperthyroidism    "over active thyroid"   Family History  Problem Relation Age of Onset  . Healthy Mother        Living  . Healthy Father        Living  . Diabetes Maternal Grandmother   . Diabetes Maternal Aunt   . Healthy Sister        x1  . Asthma Sister        #2-Resolved    BP 108/68 (BP Location: Left Arm, Patient Position: Sitting, Cuff Size: Normal)   Pulse 81   Temp 98.8 F (37.1 C) (Oral)   Ht 5\' 8"  (1.727 m)   Wt 162 lb 4 oz (73.6 kg)   LMP 07/02/2017   SpO2 99%   BMI 24.67 kg/m  General: Awake, alert, appears stated age HEENT: AT, [place], ears patent b/l and TM's neg, nares patent w/o discharge, L turbinates edematous, pharynx pink and without exudates, MMM Neck: No masses or asymmetry Heart: RRR, no murmurs, no bruits Lungs: CTAB, no accessory muscle use Psych: Age appropriate judgment and insight, normal mood and  affect  Viral URI with cough - Plan: fluticasone (FLONASE) 50 MCG/ACT nasal spray  Anxiety and depression  Poor sleep  Orders as above. Continue to push fluids, practice good hand hygiene, cover mouth when coughing. Discussed starting a medication for anxiety. She likely has a loop of poor rest and it affecting her mood and vice versa. Cont w therapy. 3-5 hrs of sleep per night is not adequate and likely why she is getting ill more often.  F/u prn. If starting to experience fevers, shaking, or shortness of breath, seek immediate care. Pt voiced understanding and agreement to the plan.  Jilda Rocheicholas Paul LovelandWendling, DO 07/26/17 11:54 AM

## 2017-07-26 NOTE — Progress Notes (Signed)
Pre visit review using our clinic review tool, if applicable. No additional management support is needed unless otherwise documented below in the visit note. 

## 2017-07-26 NOTE — Patient Instructions (Signed)
Let us know if you would like to start a medicine for your sleeping/anxiety.  Try to get 7-9 hours of sleep per night.  Ibuprofen 400-600 mg (2-3 over the counter strength tabs) every 6 hours as needed for pain.  OK to take Tylenol 1000 mg (2 extra strength tabs) or 975 mg (3 regular strength tabs) every 6 hours as needed.  Flonase (fluticasone); nasal spray that is over the counter. 2 sprays each nostril, once daily. Aim towards the same side eye when you spray.  Continue to push fluids, practice good hand hygiene, and cover your mouth if you cough.  If you start having fevers, shaking or shortness of breath, seek immediate care.

## 2017-08-15 DIAGNOSIS — F411 Generalized anxiety disorder: Secondary | ICD-10-CM | POA: Diagnosis not present

## 2017-08-21 DIAGNOSIS — F411 Generalized anxiety disorder: Secondary | ICD-10-CM | POA: Diagnosis not present

## 2017-08-28 DIAGNOSIS — F411 Generalized anxiety disorder: Secondary | ICD-10-CM | POA: Diagnosis not present

## 2017-08-30 DIAGNOSIS — F411 Generalized anxiety disorder: Secondary | ICD-10-CM | POA: Diagnosis not present

## 2017-09-04 DIAGNOSIS — F411 Generalized anxiety disorder: Secondary | ICD-10-CM | POA: Diagnosis not present

## 2017-09-19 DIAGNOSIS — F411 Generalized anxiety disorder: Secondary | ICD-10-CM | POA: Diagnosis not present

## 2017-09-30 DIAGNOSIS — F411 Generalized anxiety disorder: Secondary | ICD-10-CM | POA: Diagnosis not present

## 2017-10-14 DIAGNOSIS — F411 Generalized anxiety disorder: Secondary | ICD-10-CM | POA: Diagnosis not present

## 2018-03-21 ENCOUNTER — Encounter (HOSPITAL_BASED_OUTPATIENT_CLINIC_OR_DEPARTMENT_OTHER): Payer: Self-pay

## 2018-03-21 ENCOUNTER — Other Ambulatory Visit: Payer: Self-pay

## 2018-03-21 ENCOUNTER — Emergency Department (HOSPITAL_BASED_OUTPATIENT_CLINIC_OR_DEPARTMENT_OTHER)
Admission: EM | Admit: 2018-03-21 | Discharge: 2018-03-21 | Disposition: A | Discharge status: Home / Self Care | Payer: BLUE CROSS/BLUE SHIELD | Attending: Emergency Medicine | Admitting: Emergency Medicine

## 2018-03-21 DIAGNOSIS — K649 Unspecified hemorrhoids: Secondary | ICD-10-CM | POA: Insufficient documentation

## 2018-03-21 DIAGNOSIS — J45909 Unspecified asthma, uncomplicated: Secondary | ICD-10-CM | POA: Insufficient documentation

## 2018-03-21 DIAGNOSIS — K625 Hemorrhage of anus and rectum: Secondary | ICD-10-CM

## 2018-03-21 LAB — CBC WITH DIFFERENTIAL/PLATELET
BASOS PCT: 0 %
Basophils Absolute: 0 10*3/uL (ref 0.0–0.1)
Eosinophils Absolute: 0.2 10*3/uL (ref 0.0–0.7)
Eosinophils Relative: 4 %
HEMATOCRIT: 32.1 % — AB (ref 36.0–46.0)
HEMOGLOBIN: 10.9 g/dL — AB (ref 12.0–15.0)
Lymphocytes Relative: 36 %
Lymphs Abs: 2.3 10*3/uL (ref 0.7–4.0)
MCH: 31 pg (ref 26.0–34.0)
MCHC: 34 g/dL (ref 30.0–36.0)
MCV: 91.2 fL (ref 78.0–100.0)
MONOS PCT: 10 %
Monocytes Absolute: 0.6 10*3/uL (ref 0.1–1.0)
NEUTROS ABS: 3.2 10*3/uL (ref 1.7–7.7)
NEUTROS PCT: 50 %
Platelets: 183 10*3/uL (ref 150–400)
RBC: 3.52 MIL/uL — AB (ref 3.87–5.11)
RDW: 12.6 % (ref 11.5–15.5)
WBC: 6.3 10*3/uL (ref 4.0–10.5)

## 2018-03-21 LAB — OCCULT BLOOD X 1 CARD TO LAB, STOOL: FECAL OCCULT BLD: POSITIVE — AB

## 2018-03-21 MED ORDER — DOCUSATE SODIUM 100 MG PO CAPS: 100.0000 mg | ORAL_CAPSULE | Freq: Two times a day (BID) | ORAL | 0 refills | Status: AC

## 2018-03-21 MED ORDER — HYDROCORTISONE 2.5 % RE CREA: TOPICAL_CREAM | RECTAL | 0 refills | Status: AC

## 2018-03-21 NOTE — ED Notes (Signed)
ED Provider at bedside. 

## 2018-03-21 NOTE — ED Triage Notes (Addendum)
C/o intermittent rectal bleeding x 2 days-denies injury/anal intercourse-NAD-steady gait

## 2018-03-21 NOTE — Discharge Instructions (Signed)
You were seen in the ED today with blood from the rectum. There is a hemorrhoid which I believe is causing your pain and bleeding. Use the cream provided. Return to the ED if bleeding worsens or you become lightheaded.

## 2018-03-21 NOTE — ED Provider Notes (Signed)
Emergency Department Provider Note   I have reviewed the triage vital signs and the nursing notes.   HISTORY  Chief Complaint Rectal Bleeding   HPI Heidi Ford is a 27 y.o. female presents to the emergency department for evaluation of rectal bleeding and some discomfort.  Symptoms have been ongoing for 2 days.  She initially had some pain during a bowel movement which is largely resolved.  She then developed light bleeding with wiping.  Denies abdominal pain.  Today she is developed more heavy bleeding that is bright red.  Denies any black/tarry stools.  No fevers or chills.  She does have a history of hemorrhoids.  Denies any lightheadedness, shortness of breath, chest pain.  Past Medical History:  Diagnosis Date  . Anxiety   . Childhood asthma    Resolved  . Depression   . Hyperthyroidism    "over active thyroid"    Patient Active Problem List   Diagnosis Date Noted  . Normocytic anemia 05/09/2016  . Postpartum pelvic abscess 05/07/2016  . S/P primary low transverse C-section 04/16/2016  . Pain in both feet 06/06/2015  . Anxiety and depression 03/21/2015    Past Surgical History:  Procedure Laterality Date  . CESAREAN SECTION N/A 04/16/2016   Procedure: CESAREAN SECTION;  Surgeon: Jaymes GraffNaima Dillard, MD;  Location: [facility];  Service: Obstetrics;  Laterality: N/A;  . WISDOM TOOTH EXTRACTION      Current Outpatient Rx  . Order #: [id number] Class: Print  . Order #: [id number] Class: Normal  . Order #: [id number] Class: Print  . Order #: [id number] Class: Normal  . Order #: [id number] Class: Normal    Allergies Patient has no known allergies.  Family History  Problem Relation Age of Onset  . Healthy Mother        Living  . Healthy Father        Living  . Diabetes Maternal Grandmother   . Diabetes Maternal Aunt   . Healthy Sister        x1  . Asthma Sister        #2-Resolved    Social History Social History   Tobacco Use  . Smoking status: Never  Smoker  . Smokeless tobacco: Never Used  Substance Use Topics  . Alcohol use: No    Alcohol/week: 0.0 oz  . Drug use: No    Review of Systems  Constitutional: No fever/chills Eyes: No visual changes. ENT: No sore throat. Cardiovascular: Denies chest pain. Respiratory: Denies shortness of breath. Gastrointestinal: No abdominal pain.  No nausea, no vomiting.  No diarrhea.  No constipation. Occasional rectal pain with BRBPR.  Genitourinary: Negative for dysuria. Musculoskeletal: Negative for back pain. Skin: Negative for rash. Neurological: Negative for headaches, focal weakness or numbness.  10-point ROS otherwise negative.  ____________________________________________   PHYSICAL EXAM:  VITAL SIGNS: ED Triage Vitals  Enc Vitals Group     BP 03/21/18 2012 113/86     Pulse Rate 03/21/18 2012 91     Resp 03/21/18 2012 16     Temp 03/21/18 2012 98.4 F (36.9 C)     Temp Source 03/21/18 2012 Oral     SpO2 03/21/18 2012 99 %     Weight 03/21/18 2012 175 lb 14.8 oz (79.8 kg)     Height 03/21/18 2012 5\' 6"  (1.676 m)     Pain Score 03/21/18 2011 0   Constitutional: Alert and oriented. Well appearing and in no acute distress. Eyes: Conjunctivae are normal.  Head: Atraumatic. Nose: No  congestion/rhinnorhea. Mouth/Throat: Mucous membranes are moist.  Neck: No stridor.   Cardiovascular: Normal rate, regular rhythm. Good peripheral circulation. Grossly normal heart sounds.   Respiratory: Normal respiratory effort.  No retractions. Lungs CTAB. Gastrointestinal: Soft and nontender. No distention. Rectal exam performed with nurse chaperone. Visible external hemorrhoid without thrombosis. BRBPR noted. No melena.  Musculoskeletal: No lower extremity tenderness nor edema. No gross deformities of extremities. Neurologic:  Normal speech and language. No gross focal neurologic deficits are appreciated.  Skin:  Skin is warm, dry and intact. No rash  noted.   ____________________________________________   LABS (all labs ordered are listed, but only abnormal results are displayed)  Labs Reviewed  CBC WITH DIFFERENTIAL/PLATELET - Abnormal; Notable for the following components:      Result Value   RBC 3.52 (*)    Hemoglobin 10.9 (*)    HCT 32.1 (*)    All other components within normal limits  OCCULT BLOOD X 1 CARD TO LAB, STOOL - Abnormal; Notable for the following components:   Fecal Occult Bld POSITIVE (*)    All other components within normal limits   ____________________________________________   PROCEDURES  Procedure(s) performed:   Procedures  None ____________________________________________   INITIAL IMPRESSION / ASSESSMENT AND PLAN / ED COURSE  Pertinent labs & imaging results that were available during my care of the patient were reviewed by me and considered in my medical decision making (see chart for details).  Patient presents to the emergency department for evaluation of rectal bleeding.  She does have bright red blood on exam. Non-thrombosed external hemorrhoid is visible on my exam.  She has no pain on rectal exam.  No fissure.  Bleeding appears to be coming from the hemorrhoid.  Plan for treatment at home and GI follow up as needed. CBC obtained with slight anemia.   At this time, I do not feel there is any life-threatening condition present. I have reviewed and discussed all results (EKG, imaging, lab, urine as appropriate), exam findings with patient. I have reviewed nursing notes and appropriate previous records.  I feel the patient is safe to be discharged home without further emergent workup. Discussed usual and customary return precautions. Patient and family (if present) verbalize understanding and are comfortable with this plan.  Patient will follow-up with their primary care provider. If they do not have a primary care provider, information for follow-up has been provided to them. All questions have  been answered.  ____________________________________________  FINAL CLINICAL IMPRESSION(S) / ED DIAGNOSES  Final diagnoses:  Rectal bleeding  Hemorrhoids, unspecified hemorrhoid type    NEW OUTPATIENT MEDICATIONS STARTED DURING THIS VISIT:  Discharge Medication List as of 03/21/2018  9:35 PM    START taking these medications   Details  docusate sodium (COLACE) 100 MG capsule Take 1 capsule (100 mg total) by mouth every 12 (twelve) hours., Starting Fri 03/21/2018, Print    hydrocortisone (ANUSOL-HC) 2.5 % rectal cream Apply rectally 2 times daily, Print        Note:  This document was prepared using Dragon voice recognition software and may include unintentional dictation errors.  Alona Bene, MD Emergency Medicine    Long, Arlyss Repress, MD 03/22/18 [phone]

## 2018-07-16 DIAGNOSIS — F411 Generalized anxiety disorder: Secondary | ICD-10-CM | POA: Diagnosis not present

## 2019-02-11 DIAGNOSIS — H5213 Myopia, bilateral: Secondary | ICD-10-CM | POA: Diagnosis not present
